# Patient Record
Sex: Female | Born: 1975 | Race: Black or African American | Hispanic: No | Smoking: Never smoker
Health system: Southern US, Community
[De-identification: ages and names within clinical notes are randomized; demographics above are authoritative.]

## PROBLEM LIST (undated history)

## (undated) DIAGNOSIS — D649 Anemia, unspecified: Secondary | ICD-10-CM

## (undated) DIAGNOSIS — J42 Unspecified chronic bronchitis: Secondary | ICD-10-CM

## (undated) DIAGNOSIS — I10 Essential (primary) hypertension: Secondary | ICD-10-CM

## (undated) DIAGNOSIS — E119 Type 2 diabetes mellitus without complications: Secondary | ICD-10-CM

## (undated) HISTORY — PX: LAPAROSCOPIC GASTRIC SLEEVE RESECTION: SHX5895

## (undated) HISTORY — PX: SALPINGOSTOMY: SHX2372

---

## 2003-04-14 ENCOUNTER — Emergency Department (HOSPITAL_COMMUNITY): Admission: EM | Admit: 2003-04-14 | Discharge: 2003-04-14 | Payer: Self-pay | Admitting: Emergency Medicine

## 2003-10-27 ENCOUNTER — Emergency Department (HOSPITAL_COMMUNITY): Admission: EM | Admit: 2003-10-27 | Discharge: 2003-10-28 | Payer: Self-pay | Admitting: Emergency Medicine

## 2004-04-20 ENCOUNTER — Emergency Department (HOSPITAL_COMMUNITY): Admission: EM | Admit: 2004-04-20 | Discharge: 2004-04-20 | Payer: Self-pay | Admitting: *Deleted

## 2006-01-11 ENCOUNTER — Encounter (INDEPENDENT_AMBULATORY_CARE_PROVIDER_SITE_OTHER): Payer: Self-pay | Admitting: Specialist

## 2006-01-11 ENCOUNTER — Other Ambulatory Visit: Admission: RE | Admit: 2006-01-11 | Discharge: 2006-01-11 | Payer: Self-pay | Admitting: Unknown Physician Specialty

## 2006-02-28 ENCOUNTER — Emergency Department (HOSPITAL_COMMUNITY): Admission: EM | Admit: 2006-02-28 | Discharge: 2006-02-28 | Payer: Self-pay | Admitting: Emergency Medicine

## 2006-07-20 ENCOUNTER — Emergency Department (HOSPITAL_COMMUNITY): Admission: EM | Admit: 2006-07-20 | Discharge: 2006-07-20 | Payer: Self-pay | Admitting: Emergency Medicine

## 2006-10-03 ENCOUNTER — Emergency Department (HOSPITAL_COMMUNITY): Admission: EM | Admit: 2006-10-03 | Discharge: 2006-10-03 | Payer: Self-pay | Admitting: Emergency Medicine

## 2007-01-09 ENCOUNTER — Emergency Department (HOSPITAL_COMMUNITY): Admission: EM | Admit: 2007-01-09 | Discharge: 2007-01-09 | Payer: Self-pay | Admitting: Emergency Medicine

## 2007-01-27 ENCOUNTER — Ambulatory Visit (HOSPITAL_COMMUNITY): Admission: RE | Admit: 2007-01-27 | Discharge: 2007-01-27 | Payer: Self-pay | Admitting: Internal Medicine

## 2007-09-28 ENCOUNTER — Emergency Department (HOSPITAL_COMMUNITY): Admission: EM | Admit: 2007-09-28 | Discharge: 2007-09-28 | Payer: Self-pay | Admitting: Emergency Medicine

## 2007-12-17 ENCOUNTER — Emergency Department (HOSPITAL_COMMUNITY): Admission: EM | Admit: 2007-12-17 | Discharge: 2007-12-17 | Payer: Self-pay | Admitting: Emergency Medicine

## 2008-05-23 ENCOUNTER — Encounter: Payer: Self-pay | Admitting: Emergency Medicine

## 2008-05-24 ENCOUNTER — Encounter: Payer: Self-pay | Admitting: Family Medicine

## 2008-05-24 ENCOUNTER — Ambulatory Visit: Payer: Self-pay | Admitting: Family Medicine

## 2008-05-24 ENCOUNTER — Inpatient Hospital Stay (HOSPITAL_COMMUNITY): Admission: AD | Admit: 2008-05-24 | Discharge: 2008-05-25 | Payer: Self-pay | Admitting: Family Medicine

## 2008-05-30 ENCOUNTER — Ambulatory Visit: Payer: Self-pay | Admitting: Obstetrics & Gynecology

## 2010-09-07 LAB — CBC
MCHC: 33.8 g/dL (ref 30.0–36.0)
Platelets: 281 10*3/uL (ref 150–400)
RBC: 4.15 MIL/uL (ref 3.87–5.11)
WBC: 9.1 10*3/uL (ref 4.0–10.5)

## 2010-10-06 NOTE — Discharge Summary (Signed)
Chelsea West, Chelsea West              ACCOUNT NO.:  [id number]   MEDICAL RECORD NO.:  [id number]          PATIENT TYPE:  INP   LOCATION:  9305                          [facility]ACILITY:  [facility]   PHYSICIAN:  Norton Blizzard, MD    DATE OF BIRTH:  09-Aug-1975   DATE OF ADMISSION:  05/24/2008  DATE OF DISCHARGE:  05/25/2008                               DISCHARGE SUMMARY   ADMISSION DIAGNOSIS:  Left ectopic pregnancy.   DISCHARGE DIAGNOSIS:  Status post left salpingectomy.   PROCEDURES:  Minilaparotomy, left salpingectomy done by Dr. Tinnie Gens  on May 24, 2008.   PERTINENT STUDIES:  The patient had a preoperative hemoglobin of 13 and  a postoperative hemoglobin of 12.6.   HOSPITAL COURSE:  The patient is a 35 year old gravida 3, para 0-0-2-0  who was sent from [facility] for evaluation of pain and vaginal  bleeding in early pregnancy.  She had an ultrasound that was notable for  a complex left adnexal mass and no pelvic fluid.  Given this diagnosis,  the patient was counseled regarding surgery.  She did have an  uncomplicated minilaparotomy and left salpingostomy on May 24, 2008.  For further details of this operation, please refer to separate dictated  operative report.  The patient did well after surgery and had an  uncomplicated postoperative course.  Her pain was controlled on oral  pain medications.  She was ambulating and voiding without difficulty and  tolerating a regular diet.  On May 25, 2008, she was deemed stable  for discharge to home.   DISCHARGE MEDICATIONS:  Percocet 5/325, 1-2 tablets p.o. q.6 h. p.r.n.  pain, ibuprofen 600 mg p.o. q.6 h. p.r.n. pain, Colace 100 mg p.o.  b.i.d. p.r.n. constipation.   DISCHARGE INSTRUCTIONS:  The patient was told to avoid lifting anything  heavier than 15 pounds for the next 4 weeks.  She was also told to not  drive while on Percocet and to proceed with activity as tolerated.  She  was told she could shower and could walk up  steps, and to keep her  incision clean and dry. The patient was told to call or come to the MAU  for any postoperative concerns, including but not limited to fevers,  abnormal incisional drainage, normal bleeding, pain that is not  controlled on medicines, or any other concerns.   FOLLOWUP APPOINTMENTS:  The patient is to return to the [facility] for  staple removal next week.  She will be called by the clinic for date and  time for this appointment.  She will also have another postoperative  evaluation 3 or 4 weeks after surgery.      Norton Blizzard, MD  Electronically Signed     UAD/MEDQ  D:  05/25/2008  T:  05/25/2008  Job:  [id number]

## 2010-10-09 NOTE — Op Note (Signed)
NAMEMELLIE, BUCCELLATO              ACCOUNT NO.:  [id number]   MEDICAL RECORD NO.:  [id number]          PATIENT TYPE:  INP   LOCATION:  9305                          [facility]:  [facility]   PHYSICIAN:  Tanya S. Shawnie Pons, M.D.   DATE OF BIRTH:  1976-01-07   DATE OF PROCEDURE:  05/25/2007  DATE OF DISCHARGE:                               OPERATIVE REPORT   PREOPERATIVE DIAGNOSIS:  Ectopic pregnancy.   POSTOPERATIVE DIAGNOSIS:  Ectopic pregnancy.   PROCEDURE:  A minilaparotomy with a left salpingectomy.   SURGEON:  Shelbie Proctor. Shawnie Pons, MD   ASSISTANT:  Zerita Boers, CNM   ANESTHESIA:  General and local, Angelica Pou, MD   FINDINGS:  Left ectopic pregnancy.   SPECIMEN:  Left tube to Pathology.   ESTIMATED BLOOD LOSS:  100 mL.   COMPLICATIONS:  None, immediately known.   REASON FOR PROCEDURE:  Briefly, the patient is a 35 year old gravida 4,  who has had multiple SABs, who comes in with 1 month of bleeding and  minimal amount of pain.  Ultrasound shows nothing in the uterus, beta  hCG was 7200, blood type is O positive, hemoglobin was 13.  There was no  free fluid in the abdomen, but on the left, there was a left-sided mass  at [facility].  The patient was subsequently transferred to [facility], where ultrasonography was repeated and pressure showed a left  ectopic pregnancy.  Given the size of this as well as her beta hCG, it  was felt she was best served with surgical treatment.  The patient is a  larger female, it was felt that the laparoscopy was very difficult.  So,  she was consented for a minilaparotomy.   PROCEDURE:  The patient was taken to the OR where she was placed in  supine position with left lateral tilt.  She was prepped and draped in  usual sterile fashion.  Foley catheter was placed inside her bladder.  A  minilaparotomy of approximately 7-8 cm was performed and Pfannenstiel  fashion was carried out underlying fascia, which was incised in the  midline.  This  incision was extended laterally.  A Kocher clamps used to  elevate the superior portion of the fascia off the underlying  pyramidalis and rectus muscles, blunt dissection was done laterally with  sharp dissection in the midline.  The pyramidalis muscles were then  split with electrocautery and two hemostats were used to tent the  peritoneum, which was entered sharply.  Upon entry into the peritoneum,  the patient was placed in Trendelenburg.  The patient's left tube was  dilated with an ectopic.  This tube was brought out of the incision and  two Kelly clamps were placed across the tube.  There appeared to be a  ruptured corpus luteum on the left ovary as well.  All this tissue was  removed.  Suture ligature was done in a running 2-0 Vicryl along the  tube to provide hemostasis.  Excellent hemostasis was complete and the  ovary was allowed to return to the abdomen.  Attention was then turned  to the  fascia, which was closed with 0 Vicryl in a running fashion.  Subcutaneous tissue was cauterized, any bleeders were cauterized in the  subcu, subcu closed with 0 plain suture, and skin closed using clips.  All instrument and lap counts were correct x2.  The patient was awakened  and taken to recovery room in stable condition.      Shelbie Proctor. Shawnie Pons, M.D.  Electronically Signed     TSP/MEDQ  D:  05/24/2008  T:  05/24/2008  Job:  [id number]

## 2010-10-15 ENCOUNTER — Emergency Department (HOSPITAL_COMMUNITY)
Admission: EM | Admit: 2010-10-15 | Discharge: 2010-10-15 | Disposition: A | Discharge status: Home / Self Care | Payer: Self-pay | Attending: Emergency Medicine | Admitting: Emergency Medicine

## 2010-10-15 ENCOUNTER — Emergency Department (HOSPITAL_COMMUNITY): Payer: Self-pay

## 2010-10-15 DIAGNOSIS — R5381 Other malaise: Secondary | ICD-10-CM | POA: Insufficient documentation

## 2010-10-15 DIAGNOSIS — R1013 Epigastric pain: Secondary | ICD-10-CM | POA: Insufficient documentation

## 2010-10-15 DIAGNOSIS — R0789 Other chest pain: Secondary | ICD-10-CM | POA: Insufficient documentation

## 2010-10-15 DIAGNOSIS — I10 Essential (primary) hypertension: Secondary | ICD-10-CM | POA: Insufficient documentation

## 2010-10-15 DIAGNOSIS — R1310 Dysphagia, unspecified: Secondary | ICD-10-CM | POA: Insufficient documentation

## 2010-10-15 DIAGNOSIS — K219 Gastro-esophageal reflux disease without esophagitis: Secondary | ICD-10-CM | POA: Insufficient documentation

## 2010-10-15 LAB — URINALYSIS, ROUTINE W REFLEX MICROSCOPIC
Bilirubin Urine: NEGATIVE
Glucose, UA: NEGATIVE mg/dL
Hgb urine dipstick: NEGATIVE
Ketones, ur: NEGATIVE mg/dL
Protein, ur: NEGATIVE mg/dL
Urobilinogen, UA: 0.2 mg/dL (ref 0.0–1.0)

## 2010-10-15 LAB — COMPREHENSIVE METABOLIC PANEL
ALT: 14 U/L (ref 0–35)
AST: 16 U/L (ref 0–37)
Albumin: 3.7 g/dL (ref 3.5–5.2)
Alkaline Phosphatase: 66 U/L (ref 39–117)
CO2: 29 mEq/L (ref 19–32)
Chloride: 103 mEq/L (ref 96–112)
Creatinine, Ser: 0.63 mg/dL (ref 0.4–1.2)
GFR calc Af Amer: >60 mL/min (ref >60)
GFR calc non Af Amer: >60 mL/min (ref >60)
Potassium: 4 mEq/L (ref 3.5–5.1)
Sodium: 139 mEq/L (ref 135–145)
Total Bilirubin: 1 mg/dL (ref 0.3–1.2)

## 2010-10-15 LAB — CBC
Hemoglobin: 13.6 g/dL (ref 12.0–15.0)
MCH: 28.8 pg (ref 26.0–34.0)
Platelets: 255 10*3/uL (ref 150–400)
RBC: 4.72 MIL/uL (ref 3.87–5.11)
WBC: 5.8 10*3/uL (ref 4.0–10.5)

## 2010-10-15 LAB — DIFFERENTIAL
Basophils Relative: 0 % (ref 0–1)
Eosinophils Absolute: 0.1 10*3/uL (ref 0.0–0.7)
Monocytes Relative: 10 % (ref 3–12)
Neutro Abs: 3.1 10*3/uL (ref 1.7–7.7)
Neutrophils Relative %: 53 % (ref 43–77)

## 2010-11-12 ENCOUNTER — Ambulatory Visit: Payer: Self-pay | Admitting: Gastroenterology

## 2010-12-11 ENCOUNTER — Emergency Department (HOSPITAL_COMMUNITY): Payer: Self-pay

## 2010-12-11 ENCOUNTER — Emergency Department (HOSPITAL_COMMUNITY)
Admission: EM | Admit: 2010-12-11 | Discharge: 2010-12-12 | Disposition: A | Discharge status: Home / Self Care | Payer: Self-pay | Attending: Emergency Medicine | Admitting: Emergency Medicine

## 2010-12-11 ENCOUNTER — Other Ambulatory Visit (HOSPITAL_COMMUNITY): Payer: Self-pay

## 2010-12-11 DIAGNOSIS — N898 Other specified noninflammatory disorders of vagina: Secondary | ICD-10-CM | POA: Insufficient documentation

## 2010-12-11 DIAGNOSIS — R110 Nausea: Secondary | ICD-10-CM | POA: Insufficient documentation

## 2010-12-11 DIAGNOSIS — R1030 Lower abdominal pain, unspecified: Secondary | ICD-10-CM

## 2010-12-11 DIAGNOSIS — R58 Hemorrhage, not elsewhere classified: Secondary | ICD-10-CM

## 2010-12-11 DIAGNOSIS — R630 Anorexia: Secondary | ICD-10-CM | POA: Insufficient documentation

## 2010-12-11 DIAGNOSIS — O200 Threatened abortion: Secondary | ICD-10-CM

## 2010-12-11 DIAGNOSIS — R109 Unspecified abdominal pain: Secondary | ICD-10-CM | POA: Insufficient documentation

## 2010-12-11 DIAGNOSIS — Z331 Pregnant state, incidental: Secondary | ICD-10-CM

## 2010-12-11 HISTORY — DX: Essential (primary) hypertension: I10

## 2010-12-11 LAB — BASIC METABOLIC PANEL
BUN: 10 mg/dL (ref 6–23)
CO2: 28 mEq/L (ref 19–32)
Glucose, Bld: 81 mg/dL (ref 70–99)
Potassium: 3.6 mEq/L (ref 3.5–5.1)
Sodium: 137 mEq/L (ref 135–145)

## 2010-12-11 LAB — URINALYSIS, ROUTINE W REFLEX MICROSCOPIC
Glucose, UA: NEGATIVE mg/dL
Ketones, ur: 15 mg/dL — AB
Leukocytes, UA: NEGATIVE
Protein, ur: 30 mg/dL — AB
pH: 6 (ref 5.0–8.0)

## 2010-12-11 LAB — CBC
HCT: 42.7 % (ref 36.0–46.0)
Hemoglobin: 14.3 g/dL (ref 12.0–15.0)
MCHC: 33.5 g/dL (ref 30.0–36.0)
MCV: 87.7 fL (ref 78.0–100.0)

## 2010-12-11 LAB — TYPE AND SCREEN
ABO/RH(D): O POS
Antibody Screen: NEGATIVE

## 2010-12-11 LAB — URINE MICROSCOPIC-ADD ON

## 2010-12-11 MED ORDER — SODIUM CHLORIDE 0.9 % IV SOLN: Freq: Once | INTRAVENOUS | Status: DC

## 2010-12-11 NOTE — Progress Notes (Signed)
Pelvic ultra sound suggest possible ectopic pregnancy. Dr Emelda Fear called.

## 2010-12-11 NOTE — ED Provider Notes (Addendum)
History     Chief Complaint  Patient presents with  . Vaginal Bleeding   Patient is a 35 y.o. female presenting with vaginal bleeding.  Vaginal Bleeding    Past Medical History  Diagnosis Date  . Hypertension   . Tubal pregnancy     Past Surgical History  Procedure Date  . Salpingostomy     History reviewed. No pertinent family history.  History  Substance Use Topics  . Smoking status: Never Smoker   . Smokeless tobacco: Not on file  . Alcohol Use: No    OB History    Grav Para Term Preterm Abortions TAB SAB Ect Mult Living   3    3  2 1         Review of Systems  Genitourinary: Positive for vaginal bleeding.    Physical Exam  BP 141/106  Pulse 86  Temp(Src) 99.4 F (37.4 C) (Oral)  Resp 20  Ht 5\' 4"  (1.626 m)  Wt 235 lb (106.595 kg)  BMI 40.34 kg/m2  SpO2 98%  LMP 09/28/2010  Physical Exam  ED Course  Procedures  MDM 11:21 PM Dr Delgaizo--called  [place] results, No IUP    Devoria Albe, MD, FACEP  Medical screening examination/treatment/procedure(s) were conducted as a shared visit with non-physician practitioner(s) and myself.  I personally evaluated the patient during the encounter  Pt is G3P0Ab2, LNP 4/8 started having vaginal bleeding with clots tonight with diffuse lower abd pain. Has seen Dr Emelda Fear in the past. Pt is alert and cooperative.   Devoria Albe, MD, FACEP  1:36 AM  Pt seen by Dr Emelda Fear, feels she has a fibroid obstructing view of gestational sac. Will see in office in 48 hours.  Pt understands instructions.  Ward Givens, MD 12/11/10 [id number]  Ward Givens, MD 12/12/10 1191  Ward Givens, MD 12/12/10 4782

## 2010-12-11 NOTE — ED Provider Notes (Signed)
History     Chief Complaint  Patient presents with  . Vaginal Bleeding   Patient is a 35 y.o. female presenting with vaginal bleeding. The history is provided by the patient.  Vaginal Bleeding This is a new problem. The current episode started today. The problem occurs constantly. The problem has been gradually worsening. Associated symptoms include abdominal pain, anorexia and nausea. Pertinent negatives include no arthralgias, chest pain, coughing, fever, neck pain or vomiting. Associated symptoms comments: Vaginal bleeding with clots. The symptoms are aggravated by nothing. She has tried nothing for the symptoms. The treatment provided no relief.    Past Medical History  Diagnosis Date  . Hypertension   . Tubal pregnancy     Past Surgical History  Procedure Date  . Salpingostomy     History reviewed. No pertinent family history.  History  Substance Use Topics  . Smoking status: Never Smoker   . Smokeless tobacco: Not on file  . Alcohol Use: No    OB History    Grav Para Term Preterm Abortions TAB SAB Ect Mult Living   3    3  2 1         Review of Systems  Constitutional: Negative for fever and activity change.       All ROS Neg except as noted in HPI  HENT: Negative for nosebleeds and neck pain.   Eyes: Negative for photophobia and discharge.  Respiratory: Negative for cough, shortness of breath and wheezing.   Cardiovascular: Negative for chest pain and palpitations.  Gastrointestinal: Positive for nausea, abdominal pain and anorexia. Negative for vomiting and blood in stool.  Genitourinary: Positive for vaginal bleeding. Negative for dysuria, frequency and hematuria.  Musculoskeletal: Negative for back pain and arthralgias.  Skin: Negative.   Neurological: Negative for dizziness, seizures and speech difficulty.  Psychiatric/Behavioral: Negative for hallucinations and confusion.    Physical Exam  BP 132/73  Pulse 74  Temp(Src) 99.4 F (37.4 C) (Oral)   Resp 20  Ht 5\' 4"  (1.626 m)  Wt 235 lb (106.595 kg)  BMI 40.34 kg/m2  SpO2 98%  LMP 09/28/2010  Physical Exam  Nursing note and vitals reviewed. Constitutional: She is oriented to person, place, and time. She appears well-developed and well-nourished.  Non-toxic appearance.  HENT:  Head: Normocephalic.  Right Ear: Tympanic membrane and external ear normal.  Left Ear: Tympanic membrane and external ear normal.  Eyes: EOM and lids are normal. Pupils are equal, round, and reactive to light.  Neck: Normal range of motion. Neck supple. Carotid bruit is not present.  Cardiovascular: Normal rate, regular rhythm, normal heart sounds, intact distal pulses and normal pulses.   Pulmonary/Chest: Breath sounds normal. No respiratory distress.  Abdominal: Soft. Bowel sounds are normal. There is tenderness in the right lower quadrant and left lower quadrant. There is no guarding.  Genitourinary:       Mild to mod blood in the vaginal vault. The os is 0.5cm open. There is cervical tenderness and adnexal tenderness without a palpable mass.  Musculoskeletal: Normal range of motion.  Lymphadenopathy:       Head (right side): No submandibular adenopathy present.       Head (left side): No submandibular adenopathy present.    She has no cervical adenopathy.  Neurological: She is alert and oriented to person, place, and time. She has normal strength. No cranial nerve deficit or sensory deficit.  Skin: Skin is warm and dry.  Psychiatric: She has a normal mood and affect.  Her speech is normal.    ED Course  Procedures  MDM I have reviewed nursing notes, vital signs, and all appropriate lab and imaging results for this patient.      Kathie Dike, [place] 12/11/10 2340

## 2010-12-11 NOTE — ED Notes (Signed)
Dr. Knapp in to see pt. 

## 2010-12-11 NOTE — ED Notes (Signed)
Alert, talking, skin warm and dry.  IV started.  Says increase in pain since  Pelvic exam.

## 2010-12-11 NOTE — Progress Notes (Signed)
Pt ambulatory without problem.

## 2010-12-11 NOTE — ED Notes (Signed)
Pt presents with vaginal bleeding starting today. Pt states she did not have a period last month but today pt started passing large blood clots.

## 2010-12-12 ENCOUNTER — Encounter (HOSPITAL_COMMUNITY): Payer: Self-pay | Admitting: *Deleted

## 2010-12-12 ENCOUNTER — Other Ambulatory Visit: Payer: Self-pay | Admitting: *Deleted

## 2010-12-12 ENCOUNTER — Other Ambulatory Visit (HOSPITAL_COMMUNITY): Payer: Self-pay | Admitting: Emergency Medicine

## 2010-12-12 MED ORDER — MORPHINE SULFATE 4 MG/ML IJ SOLN
4.0000 mg | Freq: Once | INTRAMUSCULAR | Status: AC
  Administered 2010-12-12: 4 mg via INTRAVENOUS
  Filled 2010-12-12: qty 1

## 2010-12-12 MED ORDER — ONDANSETRON HCL 4 MG/2ML IJ SOLN
4.0000 mg | Freq: Once | INTRAMUSCULAR | Status: AC
  Administered 2010-12-12: 4 mg via INTRAVENOUS
  Filled 2010-12-12: qty 2

## 2010-12-12 NOTE — Progress Notes (Signed)
  Patient seen in ED . Consult note written.  See consult tab

## 2010-12-12 NOTE — Consult Note (Signed)
  Chelsea West is a 35 yr old G28 P31 s/p ectopic x 1 with positive HCG test 8000 in [facility] ER tonight with Ultrasound not showing an identifiable gest sac, but also no discernable pelvic fluid or adnexal mass. Fibroids are noted in the anterior uterine fundus, and there is significant transmission loss behind the fibroid .  The possibility of an intrauterine pregnancy is not completely excluded. Clinically the patient is quite stable, with NO significant adnexal discomfort. I will therefore treat pt as a threatened ab, and see her back in 2 days at 9 am for re-eval and repeat ultrasound in my office.  Patient counseled, is comfortable, and comfortable with follow up arrangements and knows to call our Call a Nurse line for additonal directions if symptoms change, and to return to the ER if needed. Blood Type O Pos. Dx Threatened Abortion less than 8 weeks,      Uterine fibroids, anterior uterine wall,      Hx left ectopic.

## 2010-12-12 NOTE — Progress Notes (Signed)
Medical screening examination/treatment/procedure(s) were conducted as a shared visit with non-physician practitioner(s) and myself.  I personally evaluated the patient during the encounter Chelsea Albe, MD, [facility]

## 2010-12-12 NOTE — Progress Notes (Signed)
Case discussed with Dr Emelda Fear. He will see pt in the ED.

## 2010-12-14 LAB — GC/CHLAMYDIA PROBE AMP, GENITAL
Chlamydia, DNA Probe: NEGATIVE
GC Probe Amp, Genital: NEGATIVE

## 2011-02-26 LAB — URINALYSIS, ROUTINE W REFLEX MICROSCOPIC
Bilirubin Urine: NEGATIVE
Protein, ur: 30 mg/dL — AB
Specific Gravity, Urine: >1.03 — ABNORMAL HIGH (ref 1.005–1.030)
Urobilinogen, UA: 0.2 mg/dL (ref 0.0–1.0)

## 2011-02-26 LAB — URINE MICROSCOPIC-ADD ON

## 2011-02-26 LAB — DIFFERENTIAL
Basophils Relative: 0 % (ref 0–1)
Lymphocytes Relative: 34 % (ref 12–46)
Lymphs Abs: 2.3 10*3/uL (ref 0.7–4.0)
Monocytes Absolute: 0.7 10*3/uL (ref 0.1–1.0)
Monocytes Relative: 9 % (ref 3–12)
Neutro Abs: 3.8 10*3/uL (ref 1.7–7.7)

## 2011-02-26 LAB — WET PREP, GENITAL: Yeast Wet Prep HPF POC: NONE SEEN

## 2011-02-26 LAB — CBC
Hemoglobin: 13 g/dL (ref 12.0–15.0)
MCHC: 33.6 g/dL (ref 30.0–36.0)
RBC: 4.35 MIL/uL (ref 3.87–5.11)
WBC: 6.9 10*3/uL (ref 4.0–10.5)

## 2011-02-26 LAB — ABO/RH: ABO/RH(D): O POS

## 2011-02-26 LAB — GC/CHLAMYDIA PROBE AMP, GENITAL: GC Probe Amp, Genital: NEGATIVE

## 2011-03-05 LAB — STREP A DNA PROBE: Group A Strep Probe: NEGATIVE

## 2011-09-10 ENCOUNTER — Emergency Department (HOSPITAL_COMMUNITY): Payer: Self-pay

## 2011-09-10 ENCOUNTER — Encounter (HOSPITAL_COMMUNITY): Payer: Self-pay | Admitting: *Deleted

## 2011-09-10 ENCOUNTER — Emergency Department (HOSPITAL_COMMUNITY)
Admission: EM | Admit: 2011-09-10 | Discharge: 2011-09-10 | Disposition: A | Discharge status: Home / Self Care | Payer: Self-pay | Attending: Emergency Medicine | Admitting: Emergency Medicine

## 2011-09-10 DIAGNOSIS — R6883 Chills (without fever): Secondary | ICD-10-CM | POA: Insufficient documentation

## 2011-09-10 DIAGNOSIS — J3489 Other specified disorders of nose and nasal sinuses: Secondary | ICD-10-CM | POA: Insufficient documentation

## 2011-09-10 DIAGNOSIS — J42 Unspecified chronic bronchitis: Secondary | ICD-10-CM | POA: Insufficient documentation

## 2011-09-10 DIAGNOSIS — R5381 Other malaise: Secondary | ICD-10-CM | POA: Insufficient documentation

## 2011-09-10 DIAGNOSIS — I10 Essential (primary) hypertension: Secondary | ICD-10-CM | POA: Insufficient documentation

## 2011-09-10 DIAGNOSIS — R059 Cough, unspecified: Secondary | ICD-10-CM | POA: Insufficient documentation

## 2011-09-10 DIAGNOSIS — R110 Nausea: Secondary | ICD-10-CM | POA: Insufficient documentation

## 2011-09-10 DIAGNOSIS — R61 Generalized hyperhidrosis: Secondary | ICD-10-CM | POA: Insufficient documentation

## 2011-09-10 DIAGNOSIS — R42 Dizziness and giddiness: Secondary | ICD-10-CM | POA: Insufficient documentation

## 2011-09-10 DIAGNOSIS — R5383 Other fatigue: Secondary | ICD-10-CM | POA: Insufficient documentation

## 2011-09-10 DIAGNOSIS — R05 Cough: Secondary | ICD-10-CM

## 2011-09-10 DIAGNOSIS — R0602 Shortness of breath: Secondary | ICD-10-CM | POA: Insufficient documentation

## 2011-09-10 HISTORY — DX: Unspecified chronic bronchitis: J42

## 2011-09-10 NOTE — Discharge Instructions (Signed)
Arterial Hypertension Arterial hypertension (high blood pressure) is a condition of elevated pressure in your blood vessels. Hypertension over a long period of time is a risk factor for strokes, heart attacks, and heart failure. It is also the leading cause of kidney (renal) failure.  CAUSES   In Adults -- Over 90% of all hypertension has no known cause. This is called essential or primary hypertension. In the other 10% of people with hypertension, the increase in blood pressure is caused by another disorder. This is called secondary hypertension. Important causes of secondary hypertension are:   Heavy alcohol use.   Obstructive sleep apnea.   Hyperaldosterosim (Conn's syndrome).   Steroid use.   Chronic kidney failure.   Hyperparathyroidism.   Medications.   Renal artery stenosis.   Pheochromocytoma.   Cushing's disease.   Coarctation of the aorta.   Scleroderma renal crisis.   Licorice (in excessive amounts).   Drugs (cocaine, methamphetamine).  Your caregiver can explain any items above that apply to you.  In Children -- Secondary hypertension is more common and should always be considered.   Pregnancy -- Few women of childbearing age have high blood pressure. However, up to 10% of them develop hypertension of pregnancy. Generally, this will not harm the woman. It Even be a sign of 3 complications of pregnancy: preeclampsia, HELLP syndrome, and eclampsia. Follow up and control with medication is necessary.  SYMPTOMS   This condition normally does not produce any noticeable symptoms. It is usually found during a routine exam.   Malignant hypertension is a late problem of high blood pressure. It Monger have the following symptoms:   Headaches.   Blurred vision.   End-organ damage (this means your kidneys, heart, lungs, and other organs are being damaged).   Stressful situations can increase the blood pressure. If a person with normal blood pressure has their blood  pressure go up while being seen by their caregiver, this is often termed "white coat hypertension." Its importance is not known. It Alkire be related with eventually developing hypertension or complications of hypertension.   Hypertension is often confused with mental tension, stress, and anxiety.  DIAGNOSIS  The diagnosis is made by 3 separate blood pressure measurements. They are taken at least 1 week apart from each other. If there is organ damage from hypertension, the diagnosis Lemire be made without repeat measurements. Hypertension is usually identified by having blood pressure readings:  Above 140/90 mmHg measured in both arms, at 3 separate times, over a couple weeks.   Over 130/80 mmHg should be considered a risk factor and Grisanti require treatment in patients with diabetes.  Blood pressure readings over 120/80 mmHg are called "pre-hypertension" even in non-diabetic patients. To get a true blood pressure measurement, use the following guidelines. Be aware of the factors that can alter blood pressure readings.  Take measurements at least 1 hour after caffeine.   Take measurements 30 minutes after smoking and without any stress. This is another reason to quit smoking - it raises your blood pressure.   Use a proper cuff size. Ask your caregiver if you are not sure about your cuff size.   Most home blood pressure cuffs are automatic. They will measure systolic and diastolic pressures. The systolic pressure is the pressure reading at the start of sounds. Diastolic pressure is the pressure at which the sounds disappear. If you are elderly, measure pressures in multiple postures. Try sitting, lying or standing.   Sit at rest for a minimum of   5 minutes before taking measurements.   You should not be on any medications like decongestants. These are found in many cold medications.   Record your blood pressure readings and review them with your caregiver.  If you have hypertension:  Your caregiver  may do tests to be sure you do not have secondary hypertension (see "causes" above).   Your caregiver may also look for signs of metabolic syndrome. This is also called Syndrome X or Insulin Resistance Syndrome. You may have this syndrome if you have type 2 diabetes, abdominal obesity, and abnormal blood lipids in addition to hypertension.   Your caregiver will take your medical and family history and perform a physical exam.   Diagnostic tests may include blood tests (for glucose, cholesterol, potassium, and kidney function), a urinalysis, or an EKG. Other tests may also be necessary depending on your condition.  PREVENTION  There are important lifestyle issues that you can adopt to reduce your chance of developing hypertension:  Maintain a normal weight.   Limit the amount of salt (sodium) in your diet.   Exercise often.   Limit alcohol intake.   Get enough potassium in your diet. Discuss specific advice with your caregiver.   Follow a DASH diet (dietary approaches to stop hypertension). This diet is rich in fruits, vegetables, and low-fat dairy products, and avoids certain fats.  PROGNOSIS  Essential hypertension cannot be cured. Lifestyle changes and medical treatment can lower blood pressure and reduce complications. The prognosis of secondary hypertension depends on the underlying cause. Many people whose hypertension is controlled with medicine or lifestyle changes can live a normal, healthy life.  RISKS AND COMPLICATIONS  While high blood pressure alone is not an illness, it often requires treatment due to its short- and long-term effects on many organs. Hypertension increases your risk for:  CVAs or strokes (cerebrovascular accident).   Heart failure due to chronically high blood pressure (hypertensive cardiomyopathy).   Heart attack (myocardial infarction).   Damage to the retina (hypertensive retinopathy).   Kidney failure (hypertensive nephropathy).  Your caregiver can  explain list items above that apply to you. Treatment of hypertension can significantly reduce the risk of complications. TREATMENT   For overweight patients, weight loss and regular exercise are recommended. Physical fitness lowers blood pressure.   Mild hypertension is usually treated with diet and exercise. A diet rich in fruits and vegetables, fat-free dairy products, and foods low in fat and salt (sodium) can help lower blood pressure. Decreasing salt intake decreases blood pressure in a 1/3 of people.   Stop smoking if you are a smoker.  The steps above are highly effective in reducing blood pressure. While these actions are easy to suggest, they are difficult to achieve. Most patients with moderate or severe hypertension end up requiring medications to bring their blood pressure down to a normal level. There are several classes of medications for treatment. Blood pressure pills (antihypertensives) will lower blood pressure by their different actions. Lowering the blood pressure by 10 mmHg may decrease the risk of complications by as much as 25%. The goal of treatment is effective blood pressure control. This will reduce your risk for complications. Your caregiver will help you determine the best treatment for you according to your lifestyle. What is excellent treatment for one person, may not be for you. HOME CARE INSTRUCTIONS   Do not smoke.   Follow the lifestyle changes outlined in the "Prevention" section.   If you are on medications, follow the directions   carefully. Blood pressure medications must be taken as prescribed. Skipping doses reduces their benefit. It also puts you at risk for problems.   Follow up with your caregiver, as directed.   If you are asked to monitor your blood pressure at home, follow the guidelines in the "Diagnosis" section above.  SEEK MEDICAL CARE IF:   You think you are having medication side effects.   You have recurrent headaches or lightheadedness.     You have swelling in your ankles.   You have trouble with your vision.  SEEK IMMEDIATE MEDICAL CARE IF:   You have sudden onset of chest pain or pressure, difficulty breathing, or other symptoms of a heart attack.   You have a severe headache.   You have symptoms of a stroke (such as sudden weakness, difficulty speaking, difficulty walking).  MAKE SURE YOU:   Understand these instructions.   Will watch your condition.   Will get help right away if you are not doing well or get worse.  Document Released: 05/10/2005 Document Revised: 04/29/2011 Document Reviewed: 12/08/2006 [facility] Patient Information 2012 [place], [place].Cough, Adult  A cough is a reflex. It helps you clear your throat and airways. A cough can help heal your body. A cough can last 2 or 3 weeks (acute) or may last more than 8 weeks (chronic). Some common causes of a cough can include an infection, allergy, or a cold. HOME CARE  Only take medicine as told by your doctor.   If given, take your medicines (antibiotics) as told. Finish them even if you start to feel better.   Use a cold steam vaporizer or humidier in your home. This can help loosen thick spit (secretions).   Sleep so you are almost sitting up (semi-upright). Use pillows to do this. This helps reduce coughing.   Rest as needed.   Stop smoking if you smoke.  GET HELP RIGHT AWAY IF:  You have yellowish-white fluid (pus) in your thick spit.   Your cough gets worse.   Your medicine does not reduce coughing, and you are losing sleep.   You cough up blood.   You have trouble breathing.   Your pain gets worse and medicine does not help.   You have a fever.  MAKE SURE YOU:   Understand these instructions.   Will watch your condition.   Will get help right away if you are not doing well or get worse.  Document Released: 01/21/2011 Document Revised: 04/29/2011 Document Reviewed: 01/21/2011 [facility] Patient Information 2012 [place],  [place].

## 2011-09-10 NOTE — ED Notes (Signed)
Pt presents to er with c/o being sick that started two days ago, pt states that she started getting sick with a cough, productive with thick yellow sputum production,  Sob with exertion, chills, nausea during the night, vomiting this am, aches to bilateral legs and lower back area.

## 2011-09-10 NOTE — ED Provider Notes (Signed)
History  This chart was scribed for Joya Gaskins, MD by Cherlynn Perches. The patient was seen in room APA04/APA04. Patient's care was started at 0853.      CSN: [id number]  Arrival date & time 09/10/11  7829   First MD Initiated Contact with Patient 09/10/11 [phone]      Chief Complaint  Patient presents with  . Cough  . Wheezing  . Chills  . Nausea  . Emesis     HPI  Chelsea West is a 36 y.o. female with a h/o chronic bronchitis who presents to the Emergency Department complaining of 2 days of sudden onset, gradually worsening mild to moderate cough with associated nausea, SOB, dizziness, diaphoresis, chills, weakness, vomiting, and nasal congestion. Pt reports that the cough is a productive cough with thick yellow sputum and that she coughed up a little bit of blood 2 days ago. Pt states that symptoms are made worse by walking. Pt has no known ill contacts. Pt denies sore throat, abdominal pain, chest pain, and HA. Pt denies smoking and alcohol use.  Past Medical History  Diagnosis Date  . Hypertension   . Tubal pregnancy   . Bronchitis, chronic     Past Surgical History  Procedure Date  . Salpingostomy     No family history on file.  History  Substance Use Topics  . Smoking status: Never Smoker   . Smokeless tobacco: Not on file  . Alcohol Use: No    OB History    Grav Para Term Preterm Abortions TAB SAB Ect Mult Living   3    3  2 1         Review of Systems  A complete 10 system review of systems was obtained and all systems are negative except as noted in the HPI and PMH.   Allergies  Penicillins  Home Medications  No current outpatient prescriptions on file.  BP 138/100  Pulse 88  Temp(Src) 98.2 F (36.8 C) (Oral)  Resp 21  Ht 5\' 3"  (1.6 m)  Wt 235 lb (106.595 kg)  BMI 41.63 kg/m2  SpO2 98%  LMP 08/18/2011  Physical Exam  CONSTITUTIONAL: Well developed/well nourished HEAD AND FACE: Normocephalic/atraumatic EYES:  EOMI/PERRL ENMT: Mucous membranes moist NECK: supple no meningeal signs SPINE:entire spine nontender CV: S1/S2 noted, no murmurs/rubs/gallops noted LUNGS: Lungs are clear to auscultation bilaterally, no apparent distress ABDOMEN: soft, nontender, no rebound or guarding GU:no cva tenderness NEURO: Pt is awake/alert, moves all extremitiesx4 EXTREMITIES: pulses normal, full ROM, no edema noted SKIN: warm, color normal PSYCH: no abnormalities of mood noted   ED Course  Procedures  DIAGNOSTIC STUDIES: Oxygen Saturation is 98% on room air, normal by my interpretation.    COORDINATION OF CARE: 9:17AM - Will get a chest xray for patient. Patient understands and agrees with initial ED impression and plan with expectations set for ED visit.  I walked patient around the ER and she had no distress, was able to speak clearly.  CXR reviewed and negative.  Given recent productive cough and congestion, likely viral URI.  I doubt acute cardiopulmonary emergency at this time.  Advised f/u for her HTN.  She is nontoxic and in no distress  The patient appears reasonably screened and/or stabilized for discharge and I doubt any other medical condition or other [facility] requiring further screening, evaluation, or treatment in the ED at this time prior to discharge.    Labs Reviewed - No data to display Dg Chest 2 View  09/10/2011  *RADIOLOGY REPORT*  Clinical Data: 36 year old female with cough and shortness of breath.  CHEST - 2 VIEW  Comparison: 10/15/2010 and earlier.  Findings: Stable lung volumes.  The cardiac size at the upper limits of normal and stable. Other mediastinal contours are within normal limits.  Visualized tracheal air column is within normal limits.  The lungs are clear.  No pneumothorax or effusion. No acute osseous abnormality identified.  IMPRESSION: No acute cardiopulmonary abnormality.  Original Report Authenticated By: Harley Hallmark, M.D.     1. Cough   2. Hypertension        MDM  Nursing notes reviewed and considered in documentation xrays reviewed and considered Previous records reviewed and considered     I personally performed the services described in this documentation, which was scribed in my presence. The recorded information has been reviewed and considered.       Joya Gaskins, MD 09/10/11 [phone]

## 2012-12-13 IMAGING — CR DG CHEST 2V
2 series · 2 of 2 positions shown · non-contrast
Comparison: 01/27/2007

CLINICAL DATA: Abdominal pain, chest pain, COPD, hypertension,
indigestion

CHEST - 2 VIEW

[view not recorded (1 of 2)]
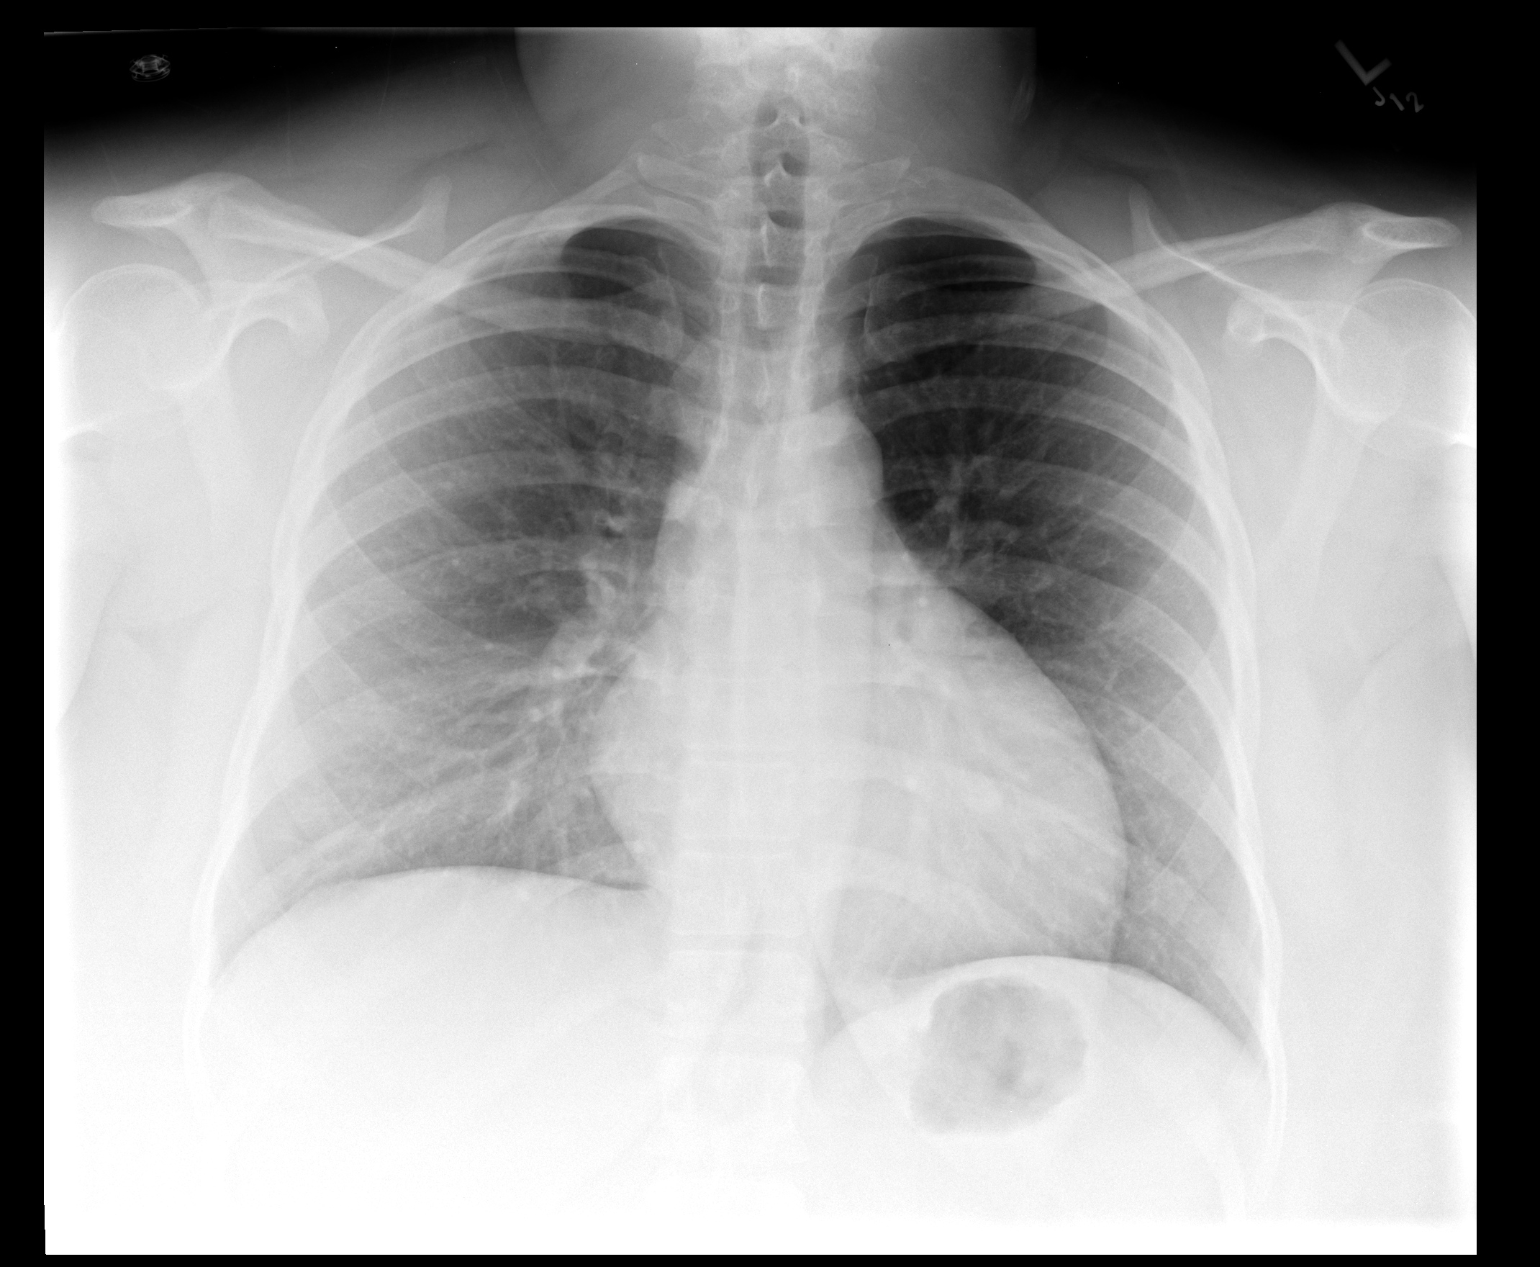

[view not recorded (2 of 2)]
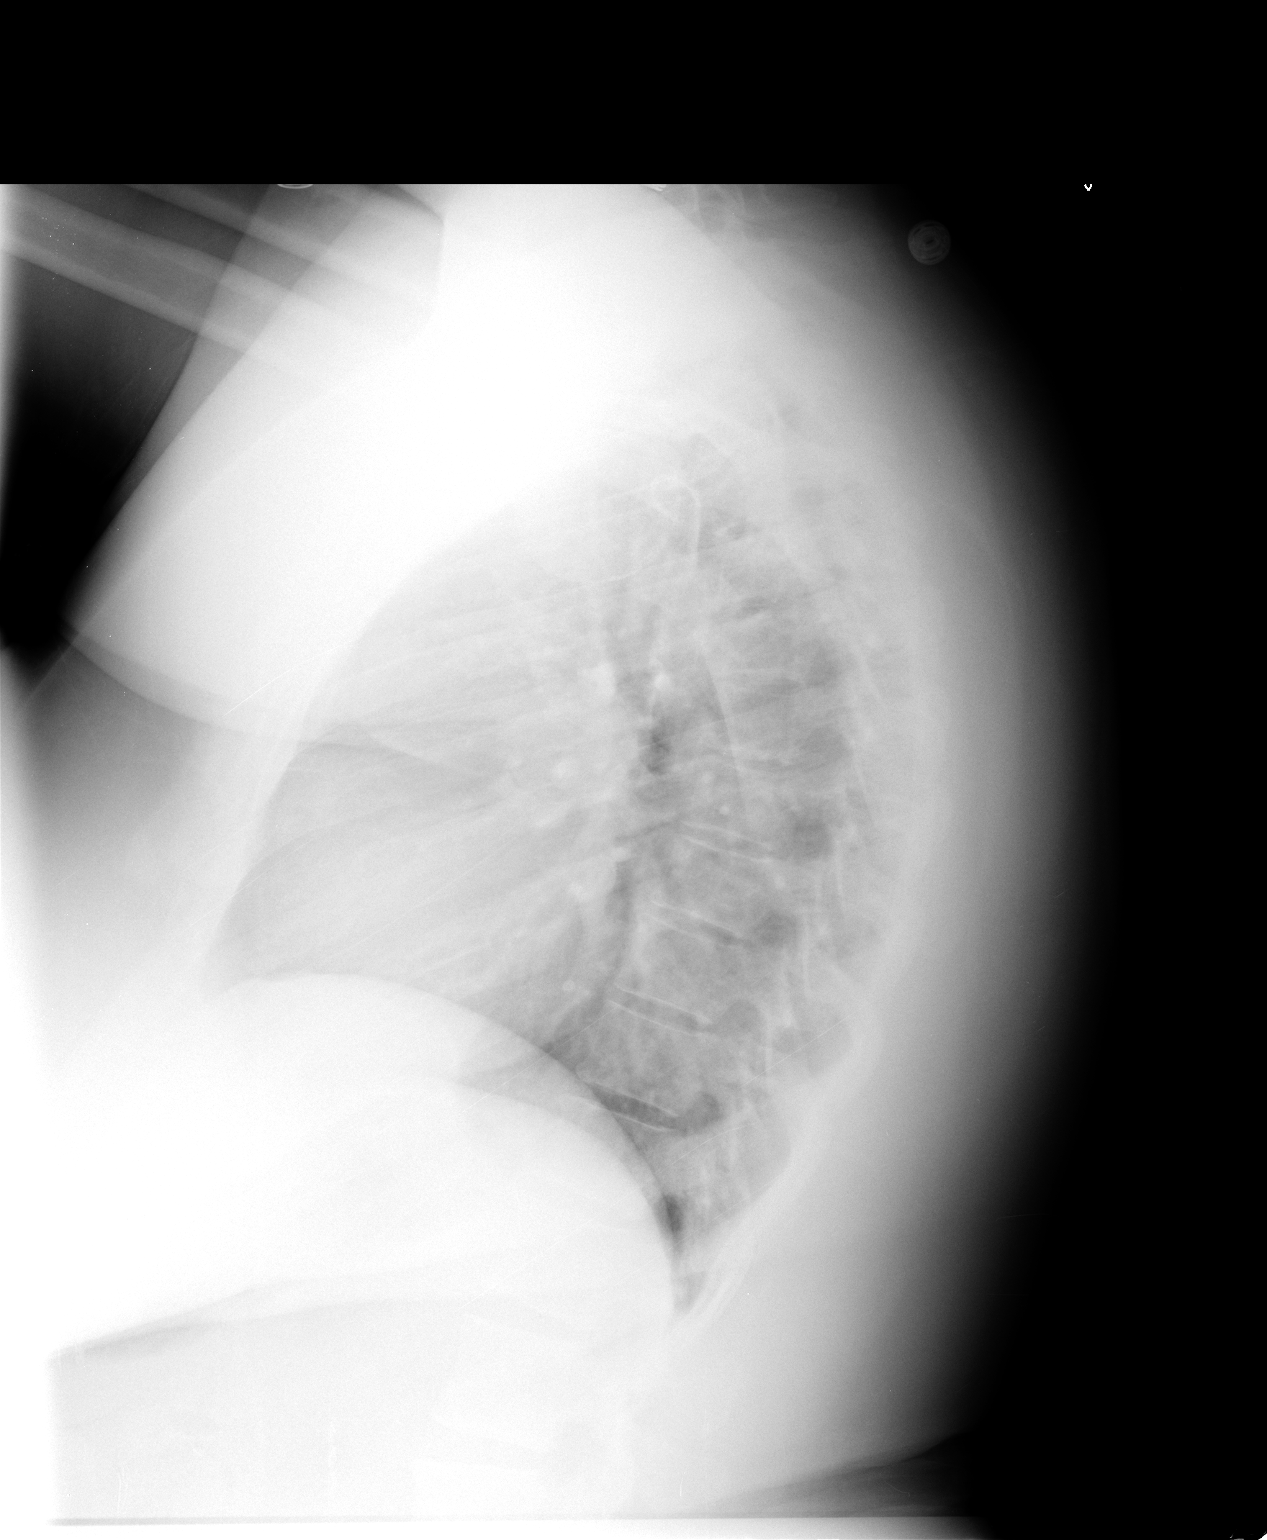

[2 of 2 positions shown; findings below may reference images not displayed]

FINDINGS: Borderline enlargement of cardiac silhouette.
Mediastinal contours and pulmonary vascularity normal.
Lungs clear.
No pleural effusion or pneumothorax.
Bones unremarkable.
IMPRESSION: Borderline enlargement of cardiac silhouette.
No acute abnormalities.

## 2014-03-25 ENCOUNTER — Encounter (HOSPITAL_COMMUNITY): Payer: Self-pay | Admitting: *Deleted

## 2019-05-16 ENCOUNTER — Encounter (HOSPITAL_COMMUNITY): Payer: Self-pay | Admitting: Emergency Medicine

## 2019-05-16 ENCOUNTER — Other Ambulatory Visit: Payer: Self-pay

## 2019-05-16 ENCOUNTER — Emergency Department (HOSPITAL_COMMUNITY)
Admission: EM | Admit: 2019-05-16 | Discharge: 2019-05-17 | Disposition: A | Discharge status: Home / Self Care | Payer: Self-pay | Attending: Emergency Medicine | Admitting: Emergency Medicine

## 2019-05-16 DIAGNOSIS — I10 Essential (primary) hypertension: Secondary | ICD-10-CM | POA: Insufficient documentation

## 2019-05-16 DIAGNOSIS — E860 Dehydration: Secondary | ICD-10-CM | POA: Insufficient documentation

## 2019-05-16 LAB — URINALYSIS, ROUTINE W REFLEX MICROSCOPIC
Bilirubin Urine: NEGATIVE
Glucose, UA: NEGATIVE mg/dL
Hgb urine dipstick: NEGATIVE
Ketones, ur: NEGATIVE mg/dL
Leukocytes,Ua: NEGATIVE
Nitrite: NEGATIVE
Protein, ur: NEGATIVE mg/dL
Specific Gravity, Urine: 1.016 (ref 1.005–1.030)
pH: 5 (ref 5.0–8.0)

## 2019-05-16 LAB — CBC WITH DIFFERENTIAL/PLATELET
Abs Immature Granulocytes: 0.05 10*3/uL (ref 0.00–0.07)
Basophils Absolute: 0 10*3/uL (ref 0.0–0.1)
Basophils Relative: 1 %
Eosinophils Absolute: 0.2 10*3/uL (ref 0.0–0.5)
Eosinophils Relative: 2 %
HCT: 35 % — ABNORMAL LOW (ref 36.0–46.0)
Hemoglobin: 10.2 g/dL — ABNORMAL LOW (ref 12.0–15.0)
Immature Granulocytes: 1 %
Lymphocytes Relative: 40 %
Lymphs Abs: 3 10*3/uL (ref 0.7–4.0)
MCH: 22.3 pg — ABNORMAL LOW (ref 26.0–34.0)
MCHC: 29.1 g/dL — ABNORMAL LOW (ref 30.0–36.0)
MCV: 76.6 fL — ABNORMAL LOW (ref 80.0–100.0)
Monocytes Absolute: 1.1 10*3/uL — ABNORMAL HIGH (ref 0.1–1.0)
Monocytes Relative: 14 %
Neutro Abs: 3.2 10*3/uL (ref 1.7–7.7)
Neutrophils Relative %: 42 %
Platelets: 480 10*3/uL — ABNORMAL HIGH (ref 150–400)
RBC: 4.57 MIL/uL (ref 3.87–5.11)
RDW: 18.5 % — ABNORMAL HIGH (ref 11.5–15.5)
WBC: 7.4 10*3/uL (ref 4.0–10.5)
nRBC: 0 % (ref 0.0–0.2)

## 2019-05-16 LAB — PREGNANCY, URINE: Preg Test, Ur: NEGATIVE

## 2019-05-16 LAB — BASIC METABOLIC PANEL
Anion gap: 12 (ref 5–15)
BUN: 35 mg/dL — ABNORMAL HIGH (ref 6–20)
CO2: 23 mmol/L (ref 22–32)
Calcium: 8.7 mg/dL — ABNORMAL LOW (ref 8.9–10.3)
Chloride: 99 mmol/L (ref 98–111)
Creatinine, Ser: 1.42 mg/dL — ABNORMAL HIGH (ref 0.44–1.00)
GFR calc Af Amer: 53 mL/min — ABNORMAL LOW (ref >60)
GFR calc non Af Amer: 45 mL/min — ABNORMAL LOW (ref >60)
Glucose, Bld: 99 mg/dL (ref 70–99)
Potassium: 4 mmol/L (ref 3.5–5.1)
Sodium: 134 mmol/L — ABNORMAL LOW (ref 135–145)

## 2019-05-16 LAB — TSH: TSH: 2.772 u[IU]/mL (ref 0.350–4.500)

## 2019-05-16 MED ORDER — SODIUM CHLORIDE 0.9 % IV BOLUS
1000.0000 mL | Freq: Once | INTRAVENOUS | Status: AC
  Administered 2019-05-16: 23:00:00 1000 mL via INTRAVENOUS

## 2019-05-16 NOTE — ED Triage Notes (Signed)
Pt c/o hypotension (88/42) x 3 days, reports hx of HTN with no med changes, takes spironolactone 25mg  1 tab daily and lisinopril-HCTZ 20/12.5mg  1 table daily; triage BP 104/63

## 2019-05-16 NOTE — ED Provider Notes (Signed)
[facility] EMERGENCY DEPARTMENT Provider Note   CSN: [id number] Arrival date & time: 05/16/19  1949     History Chief Complaint  Patient presents with  . Hypotension    Chelsea West is a 43 y.o. female.  HPI      Chelsea West is a 43 y.o. female with past medical history of hypertension  presents to the Emergency Department complaining of low blood pressure for 3 to 4 days.  She states that she has been taking lisinopril and spironolactone for some time and typically has elevated blood pressure despite her taking her medications but recently her blood pressure has been low.  She reports 88/42 at home earlier today.  She also complains of cramping to both lower legs and feeling weak all over with episodes of feeling "faint" upon sudden movement or standing.  She denies headache, visual changes, fever or chills, chest pain, shortness of breath or abdominal pain.  No dysuria.  She reports  being told by her PCP 2 years ago that she was borderline diabetic.    Past Medical History:  Diagnosis Date  . Bronchitis, chronic (HCC)   . Hypertension   . Tubal pregnancy     There are no problems to display for this patient.   Past Surgical History:  Procedure Laterality Date  . SALPINGOSTOMY       OB History    Gravida  3   Para      Term      Preterm      AB  3   Living        SAB  2   TAB      Ectopic  1   Multiple      Live Births              No family history on file.  Social History   Tobacco Use  . Smoking status: Never Smoker  . Smokeless tobacco: Never Used  Substance Use Topics  . Alcohol use: No  . Drug use: No    Home Medications Prior to Admission medications   Not on File    Allergies    Penicillins  Review of Systems   Review of Systems  Constitutional: Positive for fatigue. Negative for appetite change, chills and fever.  HENT: Negative for sore throat.   Respiratory: Negative for cough, chest tightness and  shortness of breath.   Cardiovascular: Negative for chest pain.  Gastrointestinal: Negative for abdominal pain, diarrhea, nausea and vomiting.  Genitourinary: Positive for frequency. Negative for difficulty urinating and dysuria.  Musculoskeletal: Negative for arthralgias, neck pain and neck stiffness.  Skin: Negative for rash.  Neurological: Positive for weakness and light-headedness. Negative for dizziness, syncope and headaches.    Physical Exam Updated Vital Signs BP 104/63 (BP Location: Right Arm)   Pulse (!) 107   Temp 98 F (36.7 C) (Oral)   Resp 20   Ht 5\' 3"  (1.6 m)   Wt 118.8 kg   LMP 05/01/2019   SpO2 99%   BMI 46.41 kg/m   Physical Exam Vitals and nursing note reviewed.  Constitutional:      General: She is not in acute distress.    Appearance: Normal appearance.  HENT:     Mouth/Throat:     Mouth: Mucous membranes are moist.     Pharynx: Oropharynx is clear.  Eyes:     Extraocular Movements: Extraocular movements intact.     Conjunctiva/sclera: Conjunctivae normal.  Pupils: Pupils are equal, round, and reactive to light.  Cardiovascular:     Rate and Rhythm: Normal rate and regular rhythm.     Pulses: Normal pulses.  Pulmonary:     Effort: Pulmonary effort is normal. No respiratory distress.     Breath sounds: No wheezing.  Chest:     Chest wall: No tenderness.  Abdominal:     General: There is no distension.     Palpations: Abdomen is soft.     Tenderness: There is no abdominal tenderness. There is no guarding.  Musculoskeletal:        General: Normal range of motion.     Cervical back: Normal range of motion. No tenderness.  Lymphadenopathy:     Cervical: No cervical adenopathy.  Skin:    General: Skin is warm.     Capillary Refill: Capillary refill takes less than 2 seconds.     Findings: No erythema or rash.  Neurological:     General: No focal deficit present.     Mental Status: She is alert.     Sensory: Sensation is intact. No sensory  deficit.     Motor: Motor function is intact. No weakness.     Coordination: Coordination is intact.     Gait: Gait is intact.     Comments: CN II-XII intact.  Speech clear, no pronator drift.  nml finger-nose, heel shin testing.       ED Results / Procedures / Treatments   Labs (all labs ordered are listed, but only abnormal results are displayed) Labs Reviewed  BASIC METABOLIC PANEL - Abnormal; Notable for the following components:      Result Value   Sodium 134 (*)    BUN 35 (*)    Creatinine, Ser 1.42 (*)    Calcium 8.7 (*)    GFR calc non Af Amer 45 (*)    GFR calc Af Amer 53 (*)    All other components within normal limits  CBC WITH DIFFERENTIAL/PLATELET - Abnormal; Notable for the following components:   Hemoglobin 10.2 (*)    HCT 35.0 (*)    MCV 76.6 (*)    MCH 22.3 (*)    MCHC 29.1 (*)    RDW 18.5 (*)    Platelets 480 (*)    Monocytes Absolute 1.1 (*)    All other components within normal limits  URINALYSIS, ROUTINE W REFLEX MICROSCOPIC  PREGNANCY, URINE  TSH    EKG EKG Interpretation  Date/Time:  Thursday May 17 2019 00:46:14 EST Ventricular Rate:  87 PR Interval:    QRS Duration: 85 QT Interval:  373 QTC Calculation: 449 R Axis:   88 Text Interpretation: Sinus rhythm Low voltage, precordial leads Probable anteroseptal infarct, old No previous ECGs available Confirmed by Ripley Fraise [phone]) on 05/17/2019 1:07:36 AM     Radiology No results found.   Procedures Procedures (including critical care time)  Medications Ordered in ED Medications - No data to display  ED Course  I have reviewed the triage vital signs and the nursing notes.  Pertinent labs & imaging results that were available during my care of the patient were reviewed by me and considered in my medical decision making (see chart for details).    MDM Rules/Calculators/A&P                      Patient well-appearing.  Nontoxic-appearing.  Subjective hypotension at home.   Blood pressure 332 systolic here.  Patient is ambulatory,  gait steady.  No focal neuro deficits.  Patient does not appear septic.  Will check labs and orthostatic vital signs.   On recheck, patient has ambulated in the dept and gait is steady.  She has drank fluids and received IVF's and reports feeling much better.     Orthostatic VS for the past 24 hrs:  BP- Lying Pulse- Lying BP- Sitting Pulse- Sitting BP- Standing at 0 minutes Pulse- Standing at 0 minutes  05/17/19 0017 107/55 96 -- -- 99/63 99  05/16/19 2200 (!) 87/57 102 98/64 89 (!) 87/53 101    Patient appears appropriate for discharge home, will have her hold her blood pressure medications for 24 hours and start back with one half dose of her spironolactone until recheck with PCP and closely monitor blood pressure at home.   She agrees to follow-up with her PCPs office tomorrow for recheck of kidney function.    Final Clinical Impression(s) / ED Diagnoses Final diagnoses:  Dehydration    Rx / DC Orders ED Discharge Orders    None       Pauline Ausriplett, Mikie Misner, PA-C 05/17/19 0108    Geoffery Lyonselo, [place], MD 05/18/19 [phone]

## 2019-05-17 NOTE — Discharge Instructions (Signed)
Hold your blood pressure medications for 1 day.  It is important that you continue to drink plenty of water for the next several days.  Take 1/2 tablet of your spironolactone and call Dr. Josephine Cables office tomorrow for recheck.  Return to the ER for any worsening symptoms.  You will need to have your kidney functions rechecked next week.

## 2019-05-17 NOTE — ED Notes (Signed)
Pt ambulatory to waiting room. Pt verbalized understanding of discharge instructions.   

## 2019-06-08 ENCOUNTER — Ambulatory Visit: Payer: Self-pay | Admitting: Adult Health

## 2019-06-11 ENCOUNTER — Other Ambulatory Visit: Payer: Self-pay | Admitting: Internal Medicine

## 2019-06-11 DIAGNOSIS — Z1231 Encounter for screening mammogram for malignant neoplasm of breast: Secondary | ICD-10-CM

## 2020-02-06 ENCOUNTER — Other Ambulatory Visit: Payer: Self-pay

## 2021-06-04 ENCOUNTER — Other Ambulatory Visit: Payer: Self-pay

## 2021-06-04 ENCOUNTER — Encounter (HOSPITAL_COMMUNITY): Payer: Self-pay | Admitting: *Deleted

## 2021-06-04 ENCOUNTER — Emergency Department (HOSPITAL_COMMUNITY)
Admission: EM | Admit: 2021-06-04 | Discharge: 2021-06-04 | Disposition: A | Discharge status: Home / Self Care | Payer: 59 | Attending: Emergency Medicine | Admitting: Emergency Medicine

## 2021-06-04 DIAGNOSIS — I10 Essential (primary) hypertension: Secondary | ICD-10-CM | POA: Insufficient documentation

## 2021-06-04 DIAGNOSIS — Z79899 Other long term (current) drug therapy: Secondary | ICD-10-CM | POA: Insufficient documentation

## 2021-06-04 DIAGNOSIS — J343 Hypertrophy of nasal turbinates: Secondary | ICD-10-CM | POA: Diagnosis not present

## 2021-06-04 DIAGNOSIS — R059 Cough, unspecified: Secondary | ICD-10-CM | POA: Diagnosis present

## 2021-06-04 DIAGNOSIS — U071 COVID-19: Secondary | ICD-10-CM | POA: Insufficient documentation

## 2021-06-04 DIAGNOSIS — E119 Type 2 diabetes mellitus without complications: Secondary | ICD-10-CM | POA: Diagnosis not present

## 2021-06-04 HISTORY — DX: Anemia, unspecified: D64.9

## 2021-06-04 HISTORY — DX: Type 2 diabetes mellitus without complications: E11.9

## 2021-06-04 LAB — RESP PANEL BY RT-PCR (FLU A&B, COVID) ARPGX2
Influenza A by PCR: NEGATIVE
Influenza B by PCR: NEGATIVE
SARS Coronavirus 2 by RT PCR: POSITIVE — AB

## 2021-06-04 MED ORDER — MOLNUPIRAVIR EUA 200MG CAPSULE: 4.0000 | ORAL_CAPSULE | Freq: Two times a day (BID) | ORAL | 0 refills | Status: AC

## 2021-06-04 MED ORDER — ACETAMINOPHEN 325 MG PO TABS
650.0000 mg | ORAL_TABLET | Freq: Once | ORAL | Status: AC
  Administered 2021-06-04: 650 mg via ORAL
  Filled 2021-06-04: qty 2

## 2021-06-04 MED ORDER — BENZONATATE 200 MG PO CAPS: 200.0000 mg | ORAL_CAPSULE | Freq: Three times a day (TID) | ORAL | 0 refills | Status: DC | PRN

## 2021-06-04 NOTE — ED Triage Notes (Signed)
Pt with chills, body aches, sneezing and coughing. Loss of taste and smell 2 days ago.

## 2021-06-04 NOTE — ED Provider Notes (Signed)
[facility] EMERGENCY DEPARTMENT Provider Note   CSN: [id number] Arrival date & time: 06/04/21  1116     History  Chief Complaint  Patient presents with   Chills    BURGANDY HACKWORTH is a 46 y.o. female.  HPI     ZILPHIA KOZINSKI is a 46 y.o. female past medical history of diabetes, anemia, and hypertension who presents to the Emergency Department complaining of generalized body aches, cough,Sneezing and chills.  Symptoms have been present for 2 days.  Also endorses loss of taste and smell 1 to 2 days ago.  Some intermittent loose stools.  No fever chest pain or shortness of breath.  She is drinking orange juice and taking Tylenol intermittently.  Concerned that she may have COVID.  Has been vaccinated x2 and 1 booster.  Home Medications Prior to Admission medications   Medication Sig Start Date End Date Taking? Authorizing Provider  lisinopril-hydrochlorothiazide (ZESTORETIC) 20-12.5 MG tablet Take 1 tablet by mouth daily. 04/25/19   [provider]  spironolactone (ALDACTONE) 25 MG tablet Take 25 mg by mouth daily. 04/25/19   [provider]      Allergies    Penicillins    Review of Systems   Review of Systems  Constitutional:  Positive for chills. Negative for fever.  HENT:  Positive for congestion and sneezing. Negative for sore throat.   Respiratory:  Positive for cough. Negative for shortness of breath.   Cardiovascular:  Negative for chest pain.  Gastrointestinal:  Positive for diarrhea. Negative for abdominal pain, nausea and vomiting.  Genitourinary:  Negative for decreased urine volume, difficulty urinating and dysuria.  Musculoskeletal:  Positive for myalgias.  Neurological:  Negative for dizziness, syncope, weakness and headaches.  All other systems reviewed and are negative.  Physical Exam Updated Vital Signs BP (!) 144/100    Pulse 81    Temp 98.5 F (36.9 C) (Oral)    Resp 20    Ht 5\' 2"  (1.575 m)    Wt 118.8 kg    LMP 05/24/2021    SpO2  100%    BMI 47.92 kg/m  Physical Exam Vitals and nursing note reviewed.  Constitutional:      Appearance: Normal appearance.  HENT:     Head: Normocephalic.     Nose:     Right Turbinates: Enlarged.     Left Turbinates: Enlarged.     Mouth/Throat:     Mouth: Mucous membranes are moist.  Eyes:     Pupils: Pupils are equal, round, and reactive to light.  Cardiovascular:     Rate and Rhythm: Normal rate and regular rhythm.     Pulses: Normal pulses.  Pulmonary:     Effort: Pulmonary effort is normal.     Breath sounds: Normal breath sounds. No wheezing.  Abdominal:     Palpations: Abdomen is soft.     Tenderness: There is no abdominal tenderness. There is no guarding or rebound.  Musculoskeletal:        General: Normal range of motion.     Cervical back: Normal range of motion.  Skin:    General: Skin is warm and dry.     Findings: No rash.  Neurological:     General: No focal deficit present.     Mental Status: She is alert.     Sensory: No sensory deficit.     Motor: No weakness.    ED Results / Procedures / Treatments   Labs (all labs ordered are listed,  but only abnormal results are displayed) Labs Reviewed  RESP PANEL BY RT-PCR (FLU A&B, COVID) ARPGX2 - Abnormal; Notable for the following components:      Result Value   SARS Coronavirus 2 by RT PCR POSITIVE (*)    All other components within normal limits    EKG None  Radiology No results found.  Procedures Procedures    Medications Ordered in ED Medications  acetaminophen (TYLENOL) tablet 650 mg (650 mg Oral Given 06/04/21 1432)    ED Course/ Medical Decision Making/ A&P                           Medical Decision Making  Patient here with URI symptoms with cough, chills, loss of taste and smell.  Symptoms present for 2 days.  No prior history of COVID, has been vaccinated x2 with 1 booster.  On exam, patient well-appearing nontoxic.  Hypertensive, but denies chest pain or shortness of breath.   Has not taken her antihypertensive medications today.  No increased work of breathing on exam.  Mucous membranes are moist.  Differential diagnosis would include COVID, influenza, pneumonia, other viral process  On recheck, patient resting comfortably.  She is ambulated to the restroom without difficulty or labored breathing.  Viral testing positive for COVID.  Patient does have risk factors that include hypertension, obesity and diabetes.  Discussed treatment with antivirals.  Patient agreeable to plan.  She appears appropriate for discharge home, will follow-up closely with PCP.  Return precautions discussed.        Final Clinical Impression(s) / ED Diagnoses Final diagnoses:  COVID    Rx / DC Orders ED Discharge Orders     None         Pauline Aus, PA-C 06/04/21 1448    Jacalyn Lefevre, MD 06/04/21 1535

## 2021-06-04 NOTE — ED Notes (Signed)
Pt d/c home per MD order. Discharge summary reviewed, pt verbalizes understanding. Ambulatory off unit. No s/s of acute distress noted at discharge.  °

## 2021-06-04 NOTE — Discharge Instructions (Addendum)
Your COVID test today is positive.  You have been prescribed antivirals that may help with your symptoms.  I recommend isolation at home for 5 days.  On day 5 if you no longer have a fever without needing medications to reduce fever and you are feeling better, you may return to normal activities, but continue to wear a mask for 5 additional days.  Follow-up with your primary care provider for recheck.  Return emergency department for any new or worsening symptoms.

## 2021-08-15 ENCOUNTER — Encounter (HOSPITAL_COMMUNITY): Payer: Self-pay | Admitting: Emergency Medicine

## 2021-08-15 ENCOUNTER — Emergency Department (HOSPITAL_COMMUNITY): Payer: 59

## 2021-08-15 ENCOUNTER — Other Ambulatory Visit: Payer: Self-pay

## 2021-08-15 ENCOUNTER — Emergency Department (HOSPITAL_COMMUNITY)
Admission: EM | Admit: 2021-08-15 | Discharge: 2021-08-15 | Disposition: A | Discharge status: Home / Self Care | Payer: 59 | Attending: Emergency Medicine | Admitting: Emergency Medicine

## 2021-08-15 DIAGNOSIS — Z7984 Long term (current) use of oral hypoglycemic drugs: Secondary | ICD-10-CM | POA: Diagnosis not present

## 2021-08-15 DIAGNOSIS — E119 Type 2 diabetes mellitus without complications: Secondary | ICD-10-CM | POA: Insufficient documentation

## 2021-08-15 DIAGNOSIS — I808 Phlebitis and thrombophlebitis of other sites: Secondary | ICD-10-CM | POA: Insufficient documentation

## 2021-08-15 DIAGNOSIS — M79602 Pain in left arm: Secondary | ICD-10-CM | POA: Diagnosis present

## 2021-08-15 DIAGNOSIS — I809 Phlebitis and thrombophlebitis of unspecified site: Secondary | ICD-10-CM

## 2021-08-15 NOTE — ED Provider Notes (Signed)
?[facility] ?Provider Note ? ? ?CSN: [id number] ?Arrival date & time: 08/15/21  4481 ? ?  ? ?History ? ?Chief Complaint  ?Patient presents with  ? Arm Pain  ? ? ?Chelsea West is a 46 y.o. female. ? ?HPI ? ?Patient with medical history including diabetes presents with left arm pain.  Patient states that she went for a procedure last week and had an IV placed in her left arm.  She states a couple days after she will have pain in that left arm, she then felt a bump and hardness running up her arm, there is some bruising around the area but no overlying skin changes no paresthesias or weakness moving down her left arm, no fevers or chills.  Patient states she is never had this past she has no other complaints at this time.  She denies any leaving or aggravating factors. ? ?Home Medications ?Prior to Admission medications   ?Medication Sig Start Date End Date Taking? Authorizing Provider  ?amLODipine (NORVASC) 10 MG tablet Take 10 mg by mouth daily.   Yes [provider]  ?ferrous sulfate 325 (65 FE) MG EC tablet Take 325 mg by mouth 3 (three) times daily with meals.   Yes [provider]  ?metFORMIN (GLUCOPHAGE) 500 MG tablet Take 500 mg by mouth 2 (two) times daily with a meal.   Yes [provider]  ?omeprazole (PRILOSEC) 20 MG capsule Take 20 mg by mouth daily.   Yes [provider]  ?   ? ?Allergies    ?Penicillins   ? ?Review of Systems   ?Review of Systems  ?Constitutional:  Negative for chills and fever.  ?Respiratory:  Negative for shortness of breath.   ?Cardiovascular:  Negative for chest pain.  ?Gastrointestinal:  Negative for abdominal pain.  ?Skin:  Positive for color change.  ?Neurological:  Negative for headaches.  ? ?Physical Exam ?Updated Vital Signs ?BP (!) 136/96 (BP Location: Right Arm)   Pulse 73   Temp 98.4 ?F (36.9 ?C) (Oral)   Resp 16   Ht 5\' 2"  (1.575 m)   Wt 121.6 kg   LMP 07/18/2021   SpO2 100%   BMI 49.02 kg/m?  ?Physical  Exam ?Vitals and nursing note reviewed.  ?Constitutional:   ?   General: She is not in acute distress. ?   Appearance: Normal appearance. She is not ill-appearing or diaphoretic.  ?HENT:  ?   Head: Normocephalic and atraumatic.  ?   Nose: No congestion or rhinorrhea.  ?Eyes:  ?   Conjunctiva/sclera: Conjunctivae normal.  ?Cardiovascular:  ?   Rate and Rhythm: Normal rate and regular rhythm.  ?Pulmonary:  ?   Effort: Pulmonary effort is normal.  ?Musculoskeletal:  ?   Cervical back: Neck supple.  ?   Comments: Left arm was visualized she has some noted ecchymosis distally to the the medial epicondyle, no erythema not warm to the touch, tender to palpation she has a palpable cord and nodule, fixated but not spiculated.  2+ radial pulse.  ?Skin: ?   General: Skin is warm and dry.  ?   Coloration: Skin is not jaundiced or pale.  ?Neurological:  ?   Mental Status: She is alert and oriented to person, place, and time.  ?Psychiatric:     ?   Mood and Affect: Mood normal.  ? ? ?ED Results / Procedures / Treatments   ?Labs ?(all labs ordered are listed, but only abnormal results are displayed) ?Labs Reviewed -  No data to display ? ?EKG ?None ? ?Radiology ?US Venous Img Upper Uni Left ? ?Result Date: 08/15/2021 ?CLINICAL DATA:  Palpable cord involving the left antecubital fossa. Evaluate for DVT. EXAM: LEFT UPPER EXTREMITY VENOUS DOPPLER ULTRASOUND TECHNIQUE: Gray-scale sonography with graded compression, as well as color Doppler and duplex ultrasound were performed to evaluate the upper extremity deep venous system from the level of the subclavian vein and including the jugular, axillary, basilic, radial, ulnar and upper cephalic vein. Spectral Doppler was utilized to evaluate flow at rest and with distal augmentation maneuvers. COMPARISON:  None. FINDINGS: Contralateral Subclavian Vein: Respiratory phasicity is normal and symmetric with the symptomatic side. No evidence of thrombus. Normal compressibility. Internal Jugular  Vein: No evidence of thrombus. Normal compressibility, respiratory phasicity and response to augmentation. Subclavian Vein: No evidence of thrombus. Normal compressibility, respiratory phasicity and response to augmentation. Axillary Vein: No evidence of thrombus. Normal compressibility, respiratory phasicity and response to augmentation. Cephalic Vein: No evidence of thrombus. Normal compressibility, respiratory phasicity and response to augmentation. Basilic Vein: While the basilic vein appears widely patent proximally (image 39), there is hypoechoic occlusive thrombus involving the basilic vein at the level of the antecubital fossa and proximal forearm (images 40 through 48). The basilic vein appears patent more distally (image 51). Brachial Veins: No evidence of thrombus. Normal compressibility, respiratory phasicity and response to augmentation. Radial Veins: No evidence of thrombus. Normal compressibility, respiratory phasicity and response to augmentation. Ulnar Veins: No evidence of thrombus. Normal compressibility, respiratory phasicity and response to augmentation. Other Findings:  None visualized. IMPRESSION: 1. No evidence of DVT within the left upper extremity. 2. The examination is positive for short-segment occlusive superficial thrombophlebitis involving the basilic vein at the level of the antecubital fossa and proximal forearm. Electronically Signed   By: Simonne ComeJohn  Watts M.D.   On: 08/15/2021 10:47   ? ?Procedures ?Procedures  ? ? ?Medications Ordered in ED ?Medications - No data to display ? ?ED Course/ Medical Decision Making/ A&P ?  ?                        ?Medical Decision Making ? ?This patient presents to the ED for concern of left arm pain, this involves an extensive number of treatment options, and is a complaint that carries with it a high risk of complications and morbidity.  The differential diagnosis includes DVT, cellulitis, abscess ? ? ? ?Additional history obtained: ? ?Additional  history obtained from N/A ?External records from outside source obtained and reviewed including N/A ? ? ?Co morbidities that complicate the patient evaluation ? ?Diabetes ? ?Social Determinants of Health: ? ?N/A ? ? ? ?Lab Tests: ? ?I Ordered, and personally interpreted labs.  The pertinent results include: N/A ? ? ?Imaging Studies ordered: ? ?I ordered imaging studies including ultrasound study of left upper extremity ?I independently visualized and interpreted imaging which showed shows superficial thrombophlebitis ?I agree with the radiologist interpretation ? ? ?Cardiac Monitoring: ? ?The patient was maintained on a cardiac monitor.  I personally viewed and interpreted the cardiac monitored which showed an underlying rhythm of: N/A ? ? ?Medicines ordered and prescription drug management: ? ?I ordered medication including N/A ?I have reviewed the patients home medicines and have made adjustments as needed ? ?Critical Interventions: ? ?N/A ? ? ?Reevaluation: ? ?Presents with left arm pain, exam revealed palpable cord with some ecchymosis presents no overlying erythema lower suspicion for infection at this time likely inflammation surrounding the  vein itself.  Will obtain ultrasound to rule out of the DVT ? ?Patient updated on imaging she has no complaints is ready for discharge. ? ? ?Test Considered: ? ?CBC but will defer as my suspicion for infection very low at this time as she is nontoxic-appearing no sign of infection present on my exam ? ? ? ?Rule out ?DVT unlikely as ultrasound is negative for this finding.  Low suspicion for cellulitis and/or abscess as there is no erythema, no fluctuant duration present my exam. ? ? ? ?Dispostion and problem list ? ?After consideration of the diagnostic results and the patients response to treatment, I feel that the patent would benefit from discharge. ? ?Left arm pain-likely secondary due to thrombophlebitis recommend symptom management follow-up PCP as needed gave strict  return precautions. ? ? ? ? ? ? ? ? ? ? ? ?Final Clinical Impression(s) / ED Diagnoses ?Final diagnoses:  ?Thrombophlebitis  ? ? ?Rx / DC Orders ?ED Discharge Orders   ? ? None  ? ?  ? ? ?  ?Carroll Sage, PA-C

## 2021-08-15 NOTE — Discharge Instructions (Signed)
Imaging shows just some inflammation of your vein this will resolve on its own recommend anti-inflammatories like ibuprofen this will help with pain and swelling.  may also apply warm compress to the area  ? ?Please follow-up PCP as needed ? ?Come back to the emergency department if you develop chest pain, shortness of breath, severe abdominal pain, uncontrolled nausea, vomiting, diarrhea. ? ?

## 2021-08-15 NOTE — ED Triage Notes (Signed)
Patient c/o left lower/AC arm pain that started Last Thursday 3/16 after having an IV placed for a surgery. Per patient area of IV site tender and hard to touch as well as discoloration that is starting to spread up arm. Radial pulse present.  ?

## 2022-04-21 ENCOUNTER — Other Ambulatory Visit: Payer: Self-pay

## 2022-04-21 ENCOUNTER — Emergency Department (HOSPITAL_COMMUNITY)
Admission: EM | Admit: 2022-04-21 | Discharge: 2022-04-21 | Disposition: A | Discharge status: Home / Self Care | Payer: 59 | Attending: Emergency Medicine | Admitting: Emergency Medicine

## 2022-04-21 ENCOUNTER — Emergency Department (HOSPITAL_COMMUNITY): Payer: 59

## 2022-04-21 ENCOUNTER — Encounter (HOSPITAL_COMMUNITY): Payer: Self-pay | Admitting: *Deleted

## 2022-04-21 DIAGNOSIS — R197 Diarrhea, unspecified: Secondary | ICD-10-CM | POA: Diagnosis not present

## 2022-04-21 DIAGNOSIS — Z1152 Encounter for screening for COVID-19: Secondary | ICD-10-CM | POA: Insufficient documentation

## 2022-04-21 DIAGNOSIS — Z7984 Long term (current) use of oral hypoglycemic drugs: Secondary | ICD-10-CM | POA: Insufficient documentation

## 2022-04-21 DIAGNOSIS — I10 Essential (primary) hypertension: Secondary | ICD-10-CM | POA: Insufficient documentation

## 2022-04-21 DIAGNOSIS — R112 Nausea with vomiting, unspecified: Secondary | ICD-10-CM | POA: Diagnosis not present

## 2022-04-21 DIAGNOSIS — E119 Type 2 diabetes mellitus without complications: Secondary | ICD-10-CM | POA: Diagnosis not present

## 2022-04-21 DIAGNOSIS — Z79899 Other long term (current) drug therapy: Secondary | ICD-10-CM | POA: Diagnosis not present

## 2022-04-21 DIAGNOSIS — D649 Anemia, unspecified: Secondary | ICD-10-CM | POA: Diagnosis not present

## 2022-04-21 DIAGNOSIS — R519 Headache, unspecified: Secondary | ICD-10-CM | POA: Diagnosis not present

## 2022-04-21 LAB — URINALYSIS, ROUTINE W REFLEX MICROSCOPIC
Bilirubin Urine: NEGATIVE
Glucose, UA: NEGATIVE mg/dL
Hgb urine dipstick: NEGATIVE
Ketones, ur: 5 mg/dL — AB
Leukocytes,Ua: NEGATIVE
Nitrite: NEGATIVE
Protein, ur: 100 mg/dL — AB
Specific Gravity, Urine: 1.015 (ref 1.005–1.030)
pH: 8 (ref 5.0–8.0)

## 2022-04-21 LAB — CBC WITH DIFFERENTIAL/PLATELET
Abs Immature Granulocytes: 0.02 10*3/uL (ref 0.00–0.07)
Basophils Absolute: 0 10*3/uL (ref 0.0–0.1)
Basophils Relative: 1 %
Eosinophils Absolute: 0.1 10*3/uL (ref 0.0–0.5)
Eosinophils Relative: 1 %
HCT: 30.4 % — ABNORMAL LOW (ref 36.0–46.0)
Hemoglobin: 8.7 g/dL — ABNORMAL LOW (ref 12.0–15.0)
Immature Granulocytes: 0 %
Lymphocytes Relative: 40 %
Lymphs Abs: 2.5 10*3/uL (ref 0.7–4.0)
MCH: 19.8 pg — ABNORMAL LOW (ref 26.0–34.0)
MCHC: 28.6 g/dL — ABNORMAL LOW (ref 30.0–36.0)
MCV: 69.1 fL — ABNORMAL LOW (ref 80.0–100.0)
Monocytes Absolute: 0.7 10*3/uL (ref 0.1–1.0)
Monocytes Relative: 11 %
Neutro Abs: 2.9 10*3/uL (ref 1.7–7.7)
Neutrophils Relative %: 47 %
Platelets: 371 10*3/uL (ref 150–400)
RBC: 4.4 MIL/uL (ref 3.87–5.11)
RDW: 17.6 % — ABNORMAL HIGH (ref 11.5–15.5)
WBC: 6.2 10*3/uL (ref 4.0–10.5)
nRBC: 0 % (ref 0.0–0.2)

## 2022-04-21 LAB — COMPREHENSIVE METABOLIC PANEL
ALT: 9 U/L (ref 0–44)
AST: 17 U/L (ref 15–41)
Albumin: 3.7 g/dL (ref 3.5–5.0)
Alkaline Phosphatase: 64 U/L (ref 38–126)
Anion gap: 9 (ref 5–15)
BUN: 9 mg/dL (ref 6–20)
CO2: 22 mmol/L (ref 22–32)
Calcium: 8.9 mg/dL (ref 8.9–10.3)
Chloride: 105 mmol/L (ref 98–111)
Creatinine, Ser: 0.73 mg/dL (ref 0.44–1.00)
GFR, Estimated: >60 mL/min (ref >60)
Glucose, Bld: 84 mg/dL (ref 70–99)
Potassium: 3.9 mmol/L (ref 3.5–5.1)
Sodium: 136 mmol/L (ref 135–145)
Total Bilirubin: 1.5 mg/dL — ABNORMAL HIGH (ref 0.3–1.2)
Total Protein: 7.6 g/dL (ref 6.5–8.1)

## 2022-04-21 LAB — POC URINE PREG, ED: Preg Test, Ur: NEGATIVE

## 2022-04-21 LAB — RESP PANEL BY RT-PCR (RSV, FLU A&B, COVID)  RVPGX2
Influenza A by PCR: NEGATIVE
Influenza B by PCR: NEGATIVE
Resp Syncytial Virus by PCR: NEGATIVE
SARS Coronavirus 2 by RT PCR: NEGATIVE

## 2022-04-21 LAB — LIPASE, BLOOD: Lipase: 30 U/L (ref 11–51)

## 2022-04-21 MED ORDER — PROCHLORPERAZINE EDISYLATE 10 MG/2ML IJ SOLN
10.0000 mg | Freq: Once | INTRAMUSCULAR | Status: AC
  Administered 2022-04-21: 10 mg via INTRAVENOUS
  Filled 2022-04-21: qty 2

## 2022-04-21 MED ORDER — SODIUM CHLORIDE 0.9 % IV SOLN: INTRAVENOUS | Status: DC

## 2022-04-21 MED ORDER — ONDANSETRON HCL 4 MG/2ML IJ SOLN
4.0000 mg | Freq: Once | INTRAMUSCULAR | Status: AC
  Administered 2022-04-21: 4 mg via INTRAVENOUS
  Filled 2022-04-21: qty 2

## 2022-04-21 MED ORDER — DEXAMETHASONE SODIUM PHOSPHATE 10 MG/ML IJ SOLN
10.0000 mg | Freq: Once | INTRAMUSCULAR | Status: AC
  Administered 2022-04-21: 10 mg via INTRAVENOUS
  Filled 2022-04-21: qty 1

## 2022-04-21 MED ORDER — KETOROLAC TROMETHAMINE 30 MG/ML IJ SOLN: 15.0000 mg | Freq: Once | INTRAMUSCULAR | Status: DC

## 2022-04-21 MED ORDER — KETOROLAC TROMETHAMINE 15 MG/ML IJ SOLN
15.0000 mg | Freq: Once | INTRAMUSCULAR | Status: AC
  Administered 2022-04-21: 15 mg via INTRAVENOUS

## 2022-04-21 MED ORDER — DIPHENHYDRAMINE HCL 50 MG/ML IJ SOLN
12.5000 mg | Freq: Once | INTRAMUSCULAR | Status: AC
  Administered 2022-04-21: 12.5 mg via INTRAVENOUS
  Filled 2022-04-21: qty 1

## 2022-04-21 MED ORDER — ONDANSETRON HCL 4 MG PO TABS: 4.0000 mg | ORAL_TABLET | Freq: Four times a day (QID) | ORAL | 0 refills | Status: AC

## 2022-04-21 MED ORDER — SODIUM CHLORIDE 0.9 % IV BOLUS
1000.0000 mL | Freq: Once | INTRAVENOUS | Status: AC
  Administered 2022-04-21: 1000 mL via INTRAVENOUS

## 2022-04-21 MED ORDER — MORPHINE SULFATE (PF) 4 MG/ML IV SOLN
4.0000 mg | Freq: Once | INTRAVENOUS | Status: AC
  Administered 2022-04-21: 4 mg via INTRAVENOUS
  Filled 2022-04-21: qty 1

## 2022-04-21 MED ORDER — KETOROLAC TROMETHAMINE 15 MG/ML IJ SOLN
15.0000 mg | Freq: Once | INTRAMUSCULAR | Status: DC
  Filled 2022-04-21: qty 1

## 2022-04-21 NOTE — ED Triage Notes (Signed)
Pt in from home c/o HA onset yesterday at 2am with n/v/d, x 3 emesis episodes of vomiting in the last 24 hrs, and x5 loose stools in the last 24 hrs, denies abd pain, pt reports non productive cough onset yesterday and sore throat, A&O x4

## 2022-04-21 NOTE — ED Provider Notes (Cosign Needed Addendum)
[facility] Provider Note   CSN: [id number] Arrival date & time: 04/21/22  0813     History  No chief complaint on file.  HPI Chelsea West is a 46 y.o. female with hypertension, diabetes, and anemia presenting for cough, diarrhea vomiting and chills.  Symptoms started yesterday at 2 AM in the morning.  Began with a mild headache she vomited 1 time then felt better.  Headache returned and was worse started to have nausea and diarrhea. Noted to be the worst headache of her life.  Had some chills as well but no fever vomited once this morning.  States that some of the symptoms feel like when she had COVID last year.  Patient is not on a blood thinner.  Denies abdominal pain and sick contacts.  Has taken Alka-Seltzer and Tylenol Cold and flu for symptoms. Denies blood thinner use.   HPI     Home Medications Prior to Admission medications   Medication Sig Start Date End Date Taking? Authorizing Provider  ondansetron (ZOFRAN) 4 MG tablet Take 1 tablet (4 mg total) by mouth every 6 (six) hours. 04/21/22  Yes Harriet Pho, PA-C  amLODipine (NORVASC) 10 MG tablet Take 10 mg by mouth daily. Patient not taking: Reported on 04/21/2022    [provider]  ferrous sulfate 325 (65 FE) MG EC tablet Take 325 mg by mouth 3 (three) times daily with meals. Patient not taking: Reported on 04/21/2022    [provider]  metFORMIN (GLUCOPHAGE) 500 MG tablet Take 500 mg by mouth 2 (two) times daily with a meal. Patient not taking: Reported on 04/21/2022    [provider]  omeprazole (PRILOSEC) 20 MG capsule Take 20 mg by mouth daily. Patient not taking: Reported on 04/21/2022    [provider]      Allergies    Penicillins    Review of Systems   Review of Systems  Gastrointestinal:  Positive for diarrhea, nausea and vomiting.  Neurological:  Positive for headaches.    Physical Exam Updated Vital Signs BP (!) 147/87   Pulse 64    Temp 98 F (36.7 C) (Oral)   Resp 18   Ht 5\' 2"  (1.575 m)   LMP 04/06/2022 (Approximate)   SpO2 100%   BMI 49.02 kg/m  Physical Exam Vitals and nursing note reviewed.  HENT:     Head: Normocephalic and atraumatic.     Mouth/Throat:     Mouth: Mucous membranes are moist.  Eyes:     General:        Right eye: No discharge.        Left eye: No discharge.     Conjunctiva/sclera: Conjunctivae normal.  Cardiovascular:     Rate and Rhythm: Normal rate and regular rhythm.     Pulses: Normal pulses.     Heart sounds: Normal heart sounds.  Pulmonary:     Effort: Pulmonary effort is normal.     Breath sounds: Normal breath sounds.  Abdominal:     General: Abdomen is flat.     Palpations: Abdomen is soft.  Skin:    General: Skin is warm and dry.  Neurological:     General: No focal deficit present.     Comments: GCS 15. Speech is goal oriented. No deficits appreciated to CN III-XII; symmetric eyebrow raise, no facial drooping, tongue midline. Patient has equal grip strength bilaterally with 5/5 strength against resistance in all major muscle groups bilaterally. Sensation to light touch  intact. Patient moves extremities without ataxia. Normal finger-nose-finger. Patient ambulatory with steady gait.   Psychiatric:        Mood and Affect: Mood normal.     ED Results / Procedures / Treatments   Labs (all labs ordered are listed, but only abnormal results are displayed) Labs Reviewed  CBC WITH DIFFERENTIAL/PLATELET - Abnormal; Notable for the following components:      Result Value   Hemoglobin 8.7 (*)    HCT 30.4 (*)    MCV 69.1 (*)    MCH 19.8 (*)    MCHC 28.6 (*)    RDW 17.6 (*)    All other components within normal limits  COMPREHENSIVE METABOLIC PANEL - Abnormal; Notable for the following components:   Total Bilirubin 1.5 (*)    All other components within normal limits  URINALYSIS, ROUTINE W REFLEX MICROSCOPIC - Abnormal; Notable for the following components:    APPearance HAZY (*)    Ketones, ur 5 (*)    Protein, ur 100 (*)    Bacteria, UA RARE (*)    All other components within normal limits  RESP PANEL BY RT-PCR (RSV, FLU A&B, COVID)  RVPGX2  LIPASE, BLOOD  POC URINE PREG, ED    EKG None  Radiology CT Head Wo Contrast  Result Date: 04/21/2022 CLINICAL DATA:  Headache. Increasing frequency and severity. Nausea and vomiting. EXAM: CT HEAD WITHOUT CONTRAST TECHNIQUE: Contiguous axial images were obtained from the base of the skull through the vertex without intravenous contrast. RADIATION DOSE REDUCTION: This exam was performed according to the departmental dose-optimization program which includes automated exposure control, adjustment of the mA and/or kV according to patient size and/or use of iterative reconstruction technique. COMPARISON:  None Available. FINDINGS: Brain: The brain shows a normal appearance without evidence of malformation, atrophy, old or acute small or large vessel infarction, mass lesion, hemorrhage, hydrocephalus or extra-axial collection. Vascular: No hyperdense vessel. No evidence of atherosclerotic calcification. Skull: Normal.  No traumatic finding.  No focal bone lesion. Sinuses/Orbits: Sinuses are clear. Orbits appear normal. Mastoids are clear. Other: None significant IMPRESSION: Normal head CT Electronically Signed   By: Paulina Fusi M.D.   On: 04/21/2022 14:21    Procedures Procedures    Medications Ordered in ED Medications  sodium chloride 0.9 % bolus 1,000 mL (1,000 mLs Intravenous New Bag/Given 04/21/22 1232)    And  0.9 %  sodium chloride infusion (has no administration in time range)  ondansetron (ZOFRAN) injection 4 mg (4 mg Intravenous Given 04/21/22 0944)  ketorolac (TORADOL) 15 MG/ML injection 15 mg (15 mg Intravenous Given 04/21/22 0946)  morphine (PF) 4 MG/ML injection 4 mg (4 mg Intravenous Given 04/21/22 1232)  diphenhydrAMINE (BENADRYL) injection 12.5 mg (12.5 mg Intravenous Given 04/21/22 1230)   dexamethasone (DECADRON) injection 10 mg (10 mg Intravenous Given 04/21/22 1232)  prochlorperazine (COMPAZINE) injection 10 mg (10 mg Intravenous Given 04/21/22 1446)    ED Course/ Medical Decision Making/ A&P                           Medical Decision Making Amount and/or Complexity of Data Reviewed Labs: ordered. Radiology: ordered.  Risk Prescription drug management.   Initial Impression and Ddx Patient is a well-appearing nontoxic 46 year old female presenting for chills diarrhea nausea vomiting and headache.  Differential diagnosis for this complaint includes covid, flu, SAH, hypertensive emergency. Patient PMH that increases complexity of ED encounter: Hypertension, diabetes and anemia  Interpretation of Diagnostics  I independent reviewed and interpreted the labs as followed: Anemia, hyperbilirubinemia   - I independently visualized the following imaging with scope of interpretation limited to determining acute life threatening conditions related to emergency care: Head CT which did not reveal any abnormal findings.  Patient Reassessment and Ultimate Disposition/Management Treated headache with headache cocktail and fluids and upon reevaluation patient stated that her symptoms have improved. Treated nausea with zofran. Did consider intracranial pathology given that headache is the worst of her life but reassured that headache onset was insidious and mild and progressively worsened over time indicating likely not acute intracranial hemorrhage.  CT scan also unremarkable.  Considered flu COVID and RSV but unlikely given negative PCR.  Symptoms consistent with a viral gastroenteritis.  Headache could be related to hypovolemia secondary to significant vomiting diarrhea in the last couple days.  Patient management required discussion with the following services or consulting groups:  None  Complexity of Problems Addressed Acute complicated illness or Injury  Additional Data  Reviewed and Analyzed Further history obtained from: Prior ED visit notes and Recent PCP notes  Patient Encounter Risk Assessment Prescriptions         Final Clinical Impression(s) / ED Diagnoses Final diagnoses:  Nonintractable headache, unspecified chronicity pattern, unspecified headache type  Nausea and vomiting, unspecified vomiting type  Diarrhea, unspecified type    Rx / DC Orders ED Discharge Orders          Ordered    ondansetron (ZOFRAN) 4 MG tablet  Every 6 hours        04/21/22 1604              Harriet Pho, PA-C 04/21/22 [place], Laurabelle Gorczyca K, PA-C 04/21/22 1605    Cristie Hem, MD 04/22/22 [phone]

## 2022-04-21 NOTE — ED Notes (Signed)
Generalized body aches and nausea improved slightly over the past 2 hours, but are now back to initial severity. Pt vomited within the past 5 minutes.

## 2022-04-21 NOTE — Discharge Instructions (Signed)
Evaluation of your headache and nausea vomiting diarrhea is overall reassuring.  CT of your head did not reveal any acute process and was otherwise normal.  Nausea vomiting diarrhea is likely result of a viral gastroenteritis and will get better with time.  Recommend that he continue good hydration and a bland diet and can advance as tolerated.  He can take Zofran as needed for nausea.  If you have new abdominal pain, new fever, intractable vomiting diarrhea please return to the emergency department for evaluation.

## 2022-07-19 ENCOUNTER — Emergency Department (HOSPITAL_COMMUNITY)
Admission: EM | Admit: 2022-07-19 | Discharge: 2022-07-19 | Disposition: A | Discharge status: Home / Self Care | Payer: 59 | Attending: Emergency Medicine | Admitting: Emergency Medicine

## 2022-07-19 ENCOUNTER — Encounter (HOSPITAL_COMMUNITY): Payer: Self-pay | Admitting: Emergency Medicine

## 2022-07-19 DIAGNOSIS — Z1152 Encounter for screening for COVID-19: Secondary | ICD-10-CM | POA: Diagnosis not present

## 2022-07-19 DIAGNOSIS — D649 Anemia, unspecified: Secondary | ICD-10-CM | POA: Insufficient documentation

## 2022-07-19 DIAGNOSIS — R42 Dizziness and giddiness: Secondary | ICD-10-CM | POA: Diagnosis present

## 2022-07-19 DIAGNOSIS — E119 Type 2 diabetes mellitus without complications: Secondary | ICD-10-CM | POA: Insufficient documentation

## 2022-07-19 DIAGNOSIS — I10 Essential (primary) hypertension: Secondary | ICD-10-CM | POA: Insufficient documentation

## 2022-07-19 LAB — COMPREHENSIVE METABOLIC PANEL
ALT: 8 U/L (ref 0–44)
AST: 16 U/L (ref 15–41)
Albumin: 4 g/dL (ref 3.5–5.0)
Alkaline Phosphatase: 55 U/L (ref 38–126)
Anion gap: 9 (ref 5–15)
BUN: 14 mg/dL (ref 6–20)
CO2: 24 mmol/L (ref 22–32)
Calcium: 8.9 mg/dL (ref 8.9–10.3)
Chloride: 105 mmol/L (ref 98–111)
Creatinine, Ser: 0.72 mg/dL (ref 0.44–1.00)
GFR, Estimated: >60 mL/min (ref >60)
Glucose, Bld: 79 mg/dL (ref 70–99)
Potassium: 3.6 mmol/L (ref 3.5–5.1)
Sodium: 138 mmol/L (ref 135–145)
Total Bilirubin: 1.7 mg/dL — ABNORMAL HIGH (ref 0.3–1.2)
Total Protein: 7.9 g/dL (ref 6.5–8.1)

## 2022-07-19 LAB — RESP PANEL BY RT-PCR (RSV, FLU A&B, COVID)  RVPGX2
Influenza A by PCR: NEGATIVE
Influenza B by PCR: NEGATIVE
Resp Syncytial Virus by PCR: NEGATIVE
SARS Coronavirus 2 by RT PCR: NEGATIVE

## 2022-07-19 LAB — CBC WITH DIFFERENTIAL/PLATELET
Abs Immature Granulocytes: 0.01 10*3/uL (ref 0.00–0.07)
Basophils Absolute: 0 10*3/uL (ref 0.0–0.1)
Basophils Relative: 1 %
Eosinophils Absolute: 0 10*3/uL (ref 0.0–0.5)
Eosinophils Relative: 1 %
HCT: 27.4 % — ABNORMAL LOW (ref 36.0–46.0)
Hemoglobin: 7.6 g/dL — ABNORMAL LOW (ref 12.0–15.0)
Immature Granulocytes: 0 %
Lymphocytes Relative: 24 %
Lymphs Abs: 1.4 10*3/uL (ref 0.7–4.0)
MCH: 17.9 pg — ABNORMAL LOW (ref 26.0–34.0)
MCHC: 27.7 g/dL — ABNORMAL LOW (ref 30.0–36.0)
MCV: 64.5 fL — ABNORMAL LOW (ref 80.0–100.0)
Monocytes Absolute: 0.6 10*3/uL (ref 0.1–1.0)
Monocytes Relative: 9 %
Neutro Abs: 3.9 10*3/uL (ref 1.7–7.7)
Neutrophils Relative %: 65 %
Platelets: 336 10*3/uL (ref 150–400)
RBC: 4.25 MIL/uL (ref 3.87–5.11)
RDW: 20.2 % — ABNORMAL HIGH (ref 11.5–15.5)
WBC: 6 10*3/uL (ref 4.0–10.5)
nRBC: 0 % (ref 0.0–0.2)

## 2022-07-19 MED ORDER — SODIUM CHLORIDE 0.9 % IV BOLUS
1000.0000 mL | Freq: Once | INTRAVENOUS | Status: AC
  Administered 2022-07-19: 1000 mL via INTRAVENOUS

## 2022-07-19 NOTE — ED Triage Notes (Addendum)
Pt has hx of gastric sleeve. She does not eat much. States she has been dizzy since yesterday and took BP at home and it was 88/63. She states she had another episode today. BP is 148/85. Pt denies LOC. Hx of anemia. Takes iron supplements 3 times day.

## 2022-07-19 NOTE — Discharge Instructions (Addendum)
You are seen today for dizziness, I think your lightheadedness is secondary to dehydration.  Drink plenty of fluids, you also are very anemic.  Follow-up with your primary care doctor regarding this and continue taking your iron supplements.  Return to the ED for new or concerning symptoms.

## 2022-07-19 NOTE — ED Provider Notes (Signed)
[facility] Provider Note   CSN: [id number] Arrival date & time: 07/19/22  1326     History  Chief Complaint  Patient presents with   Dizziness    Chelsea West is a 47 y.o. female.   Dizziness    Patient with medical history of chronic bronchitis, diabetes, hypertension, status post gastric sleeve, chronic anemia presents to the emergency department due to lightheadedness.  Started yesterday, initially it happened 3 times yesterday when she went from sitting to standing she folic she was lightheaded and that the room was spinning.  This resolved with rest.  This morning she felt fine but while at work she felt lightheaded again, she checked her blood pressure and it was low at 88 systolically, called her primary care doctor advised him to the ED.  Patient denies any recent changes in medication, states she does not drink or eat as much as the gastric sleeve last year and sometimes she can get dehydrated without realizing it.  She has had some nasal congestion and a nonproductive cough for the last few days, denies any chest pain or shortness of breath.  She has not had any dark or tarry stool.  Home Medications Prior to Admission medications   Medication Sig Start Date End Date Taking? Authorizing Provider  amLODipine (NORVASC) 10 MG tablet Take 10 mg by mouth daily. Patient not taking: Reported on 04/21/2022    [provider]  ferrous sulfate 325 (65 FE) MG EC tablet Take 325 mg by mouth 3 (three) times daily with meals. Patient not taking: Reported on 04/21/2022    [provider]  metFORMIN (GLUCOPHAGE) 500 MG tablet Take 500 mg by mouth 2 (two) times daily with a meal. Patient not taking: Reported on 04/21/2022    [provider]  omeprazole (PRILOSEC) 20 MG capsule Take 20 mg by mouth daily. Patient not taking: Reported on 04/21/2022    [provider]  ondansetron (ZOFRAN) 4 MG tablet Take 1  tablet (4 mg total) by mouth every 6 (six) hours. 04/21/22   Harriet Pho, PA-C      Allergies    Penicillins    Review of Systems   Review of Systems  Neurological:  Positive for dizziness.    Physical Exam Updated Vital Signs BP (!) 140/88   Pulse (!) 55   Temp 98.6 F (37 C) (Oral)   Resp 18   SpO2 100%  Physical Exam Vitals and nursing note reviewed. Exam conducted with a chaperone present.  Constitutional:      Appearance: Normal appearance.  HENT:     Head: Normocephalic and atraumatic.     Mouth/Throat:     Mouth: Mucous membranes are dry.  Eyes:     General: No scleral icterus.       Right eye: No discharge.        Left eye: No discharge.     Extraocular Movements: Extraocular movements intact.     Pupils: Pupils are equal, round, and reactive to light.  Cardiovascular:     Rate and Rhythm: Normal rate and regular rhythm.     Pulses: Normal pulses.     Heart sounds: Normal heart sounds. No murmur heard.    No friction rub. No gallop.     Comments: Upper and lower extremity pulses are symmetric bilaterally Pulmonary:     Effort: Pulmonary effort is normal. No respiratory distress.     Breath sounds: Normal breath sounds.  Chest:     Chest wall: No tenderness.  Abdominal:     General: Abdomen is flat. Bowel sounds are normal. There is no distension.     Palpations: Abdomen is soft.     Tenderness: There is no abdominal tenderness.  Skin:    General: Skin is warm and dry.     Coloration: Skin is not jaundiced.  Neurological:     Mental Status: She is alert. Mental status is at baseline.     Coordination: Coordination normal.     Comments: Cranial nerves II to XII are grossly, PERRLA extremity strength symmetric bilaterally     ED Results / Procedures / Treatments   Labs (all labs ordered are listed, but only abnormal results are displayed) Labs Reviewed  COMPREHENSIVE METABOLIC PANEL - Abnormal; Notable for the following components:      Result  Value   Total Bilirubin 1.7 (*)    All other components within normal limits  CBC WITH DIFFERENTIAL/PLATELET - Abnormal; Notable for the following components:   Hemoglobin 7.6 (*)    HCT 27.4 (*)    MCV 64.5 (*)    MCH 17.9 (*)    MCHC 27.7 (*)    RDW 20.2 (*)    All other components within normal limits  RESP PANEL BY RT-PCR (RSV, FLU A&B, COVID)  [id number]    EKG None  Radiology No results found.  Procedures Procedures    Medications Ordered in ED Medications  sodium chloride 0.9 % bolus 1,000 mL (1,000 mLs Intravenous New Bag/Given 07/19/22 1817)    ED Course/ Medical Decision Making/ A&P                             Medical Decision Making Amount and/or Complexity of Data Reviewed Labs: ordered.   Patient presents due to dizziness.  Differential includes not limited to symptomatic anemia, orthostatic hypotension, electrolyte derangement, dehydration, AKI, arrhythmia.  On exam patient is well-appearing, blood pressure has been stable throughout the ED although she was hypotensive at home.  She is afebrile there is no SIRS criteria, will check laboratory workup, EKG and reassess.  History and presentation seem most consistent with orthostatic hypotension, she also has a history of anemia so definitely need to check a CBC.  CBC is notable for anemia with hemoglobin of 7.7, not grossly changed compared to previous and not low enough to require blood transfusion.  There is no gross electrolyte derangement or AKI, COVID and flu are negative, EKG shows sinus rhythm without underlying arrhythmia or heart block.  I ordered a liter fluid bolus, reevaluate the patient who is feeling improved.  Orthostatic vital signs are stable without any significant hypotension, I do not think she stay in the hospital for additional workup at this time.  Discussed return precautions with patient verbalized understanding.  She will continue taking her iron supplements and following up with her PCP.   Stable for outpatient follow-up at this time.        Final Clinical Impression(s) / ED Diagnoses Final diagnoses:  Dizziness  Symptomatic anemia    Rx / DC Orders ED Discharge Orders     None         Sherrill Raring, Hershal Coria 07/19/22 1859    Noemi Chapel, MD 07/19/22 [phone]

## 2022-08-12 ENCOUNTER — Encounter: Payer: Self-pay | Admitting: Oncology

## 2022-08-12 ENCOUNTER — Inpatient Hospital Stay: Payer: Self-pay | Attending: Oncology | Admitting: Oncology

## 2022-08-12 ENCOUNTER — Inpatient Hospital Stay: Payer: Self-pay

## 2022-08-12 VITALS — BP 159/100 | HR 69 | Temp 97.0°F | Resp 16 | Ht 62.0 in | Wt 175.0 lb

## 2022-08-12 DIAGNOSIS — D509 Iron deficiency anemia, unspecified: Secondary | ICD-10-CM | POA: Insufficient documentation

## 2022-08-12 DIAGNOSIS — Z79899 Other long term (current) drug therapy: Secondary | ICD-10-CM | POA: Insufficient documentation

## 2022-08-12 DIAGNOSIS — Z9884 Bariatric surgery status: Secondary | ICD-10-CM | POA: Insufficient documentation

## 2022-08-12 LAB — IRON AND TIBC
Iron: 14 ug/dL — ABNORMAL LOW (ref 28–170)
Saturation Ratios: 3 % — ABNORMAL LOW (ref 10.4–31.8)
TIBC: 550 ug/dL — ABNORMAL HIGH (ref 250–450)
UIBC: 536 ug/dL

## 2022-08-12 LAB — FERRITIN: Ferritin: 4 ng/mL — ABNORMAL LOW (ref 11–307)

## 2022-08-12 LAB — CBC (CANCER CENTER ONLY)
HCT: 26.7 % — ABNORMAL LOW (ref 36.0–46.0)
Hemoglobin: 7.4 g/dL — ABNORMAL LOW (ref 12.0–15.0)
MCH: 17.3 pg — ABNORMAL LOW (ref 26.0–34.0)
MCHC: 27.7 g/dL — ABNORMAL LOW (ref 30.0–36.0)
MCV: 62.4 fL — ABNORMAL LOW (ref 80.0–100.0)
Platelet Count: 338 10*3/uL (ref 150–400)
RBC: 4.28 MIL/uL (ref 3.87–5.11)
RDW: 20 % — ABNORMAL HIGH (ref 11.5–15.5)
WBC Count: 4.6 10*3/uL (ref 4.0–10.5)
nRBC: 0 % (ref 0.0–0.2)

## 2022-08-12 LAB — FOLATE: Folate: 6.8 ng/mL (ref >5.9)

## 2022-08-12 NOTE — Progress Notes (Signed)
Having weakness and tiredness. Feels faint at times. Had gastric sleeve in March of 2023. Appetite is slim. Taking OTC ferrous sulfate.

## 2022-08-12 NOTE — Progress Notes (Signed)
[name]  [phone] [phone]  ID: Chelsea West OB: [birth year]  MR#: [id number]  [id number]  Patient Care Team: Gladstone Lighter, MD as PCP - General (Internal Medicine)  CHIEF COMPLAINT: Iron deficiency anemia.  INTERVAL HISTORY: Patient is a 47 year old female with history of gastric sleeve who was noted to have a declining hemoglobin on routine blood work.  She has weakness and fatigue, but otherwise feels well.  She has no neurologic complaints.  She denies any recent fevers or illnesses.  She has a good appetite and denies she has no chest pain, shortness of breath, cough, or hemoptysis.  She denies any nausea, vomiting, constipation, or diarrhea.  She has no known or hematochezia.  She has no urinary complaints.  Patient offers no further specific complaints today.  REVIEW OF SYSTEMS:   Review of Systems  Constitutional:  Positive for malaise/fatigue. Negative for fever and weight loss.  Respiratory: Negative.  Negative for cough, hemoptysis and shortness of breath.   Cardiovascular: Negative.  Negative for chest pain and leg swelling.  Gastrointestinal:  Negative for abdominal pain, blood in stool and melena.  Genitourinary: Negative.  Negative for hematuria.  Musculoskeletal: Negative.  Negative for back pain and myalgias.  Skin: Negative.  Negative for rash.  Neurological:  Positive for weakness. Negative for dizziness, focal weakness and headaches.  Psychiatric/Behavioral: Negative.  The patient is not nervous/anxious.     As per HPI. Otherwise, a complete review of systems is negative.  PAST MEDICAL HISTORY: Past Medical History:  Diagnosis Date   Anemia    Bronchitis, chronic (HCC)    Diabetes mellitus without complication ([facility])    Hypertension    Tubal pregnancy     PAST SURGICAL HISTORY: Past Surgical History:  Procedure Laterality Date   LAPAROSCOPIC GASTRIC SLEEVE RESECTION     SALPINGOSTOMY       FAMILY HISTORY: History reviewed. No pertinent family history.  ADVANCED DIRECTIVES (Y/N):  N  HEALTH MAINTENANCE: Social History   Tobacco Use   Smoking status: Never   Smokeless tobacco: Never  Vaping Use   Vaping Use: Never used  Substance Use Topics   Alcohol use: No   Drug use: No     Colonoscopy:  PAP:  Bone density:  Lipid panel:  Allergies  Allergen Reactions   Penicillins Shortness Of Breath    Current Outpatient Medications  Medication Sig Dispense Refill   ferrous sulfate 325 (65 FE) MG EC tablet Take 325 mg by mouth 3 (three) times daily with meals.     ondansetron (ZOFRAN) 4 MG tablet Take 1 tablet (4 mg total) by mouth every 6 (six) hours. 12 tablet 0   Vitamin D, Ergocalciferol, (DRISDOL) 1.25 MG (50000 UNIT) CAPS capsule Take 50,000 Units by mouth every 7 (seven) days.     amLODipine (NORVASC) 10 MG tablet Take 10 mg by mouth daily. (Patient not taking: Reported on 04/21/2022)     metFORMIN (GLUCOPHAGE) 500 MG tablet Take 500 mg by mouth 2 (two) times daily with a meal. (Patient not taking: Reported on 04/21/2022)     omeprazole (PRILOSEC) 20 MG capsule Take 20 mg by mouth daily. (Patient not taking: Reported on 04/21/2022)     No current facility-administered medications for this visit.    OBJECTIVE: Vitals:   08/12/22 1114  BP: (!) 159/100  Pulse: 69  Resp: 16  Temp: (!) 97 F (36.1 C)  SpO2: 100%     Body mass index is 32.01 kg/m.  ECOG FS:0 - Asymptomatic  General: Well-developed, well-nourished, no acute distress. Eyes: Pink conjunctiva, anicteric sclera. HEENT: Normocephalic, moist mucous membranes. Lungs: No audible wheezing or coughing. Heart: Regular rate and rhythm. Abdomen: Soft, nontender, no obvious distention. Musculoskeletal: No edema, cyanosis, or clubbing. Neuro: Alert, answering all questions appropriately. Cranial nerves grossly intact. Skin: No rashes or petechiae noted. Psych: Normal affect. Lymphatics: No  cervical, calvicular, axillary or inguinal LAD.   LAB RESULTS:  Lab Results  Component Value Date   NA 138 07/19/2022   K 3.6 07/19/2022   CL 105 07/19/2022   CO2 24 07/19/2022   GLUCOSE 79 07/19/2022   BUN 14 07/19/2022   CREATININE 0.72 07/19/2022   CALCIUM 8.9 07/19/2022   PROT 7.9 07/19/2022   ALBUMIN 4.0 07/19/2022   AST 16 07/19/2022   ALT 8 07/19/2022   ALKPHOS 55 07/19/2022   BILITOT 1.7 (H) 07/19/2022   GFRNONAA >60 07/19/2022   GFRAA 53 (L) 05/16/2019    Lab Results  Component Value Date   WBC 4.6 08/12/2022   NEUTROABS 3.9 07/19/2022   HGB 7.4 (L) 08/12/2022   HCT 26.7 (L) 08/12/2022   MCV 62.4 (L) 08/12/2022   PLT 338 08/12/2022   No results found for: "IRON", "TIBC", "IRONPCTSAT"  No results found for: "FERRITIN"   STUDIES: No results found.  ASSESSMENT: Iron deficiency anemia.  PLAN:    Iron deficiency anemia: Patient has a history of gastric sleeve that is likely contributing.  Her most recent hemoglobin is 7.4.  Iron stores, B12, folate, and hemoglobinopathy profile are all pending at time of dictation.  She has been instructed to continue oral iron supplementation.  Patient will benefit from 200 mg of IV Venofer.  Return to clinic 5 times over the next 3 weeks for treatment.  Patient will then return to clinic 3 months with repeat laboratory work, further evaluation, and continuation of treatment if needed.  I spent a total of 45 minutes reviewing chart data, face-to-face evaluation with the patient, counseling and coordination of care as detailed above.  Patient expressed understanding and was in agreement with this plan. She also understands that She can call clinic at any time with any questions, concerns, or complaints.     Lloyd Huger, MD   08/12/2022 1:06 PM

## 2022-08-13 LAB — VITAMIN B12: Vitamin B-12: 416 pg/mL (ref 180–914)

## 2022-08-13 MED FILL — Iron Sucrose Inj 20 MG/ML (Fe Equiv): INTRAVENOUS | Qty: 10 | Status: AC

## 2022-08-16 ENCOUNTER — Inpatient Hospital Stay: Payer: Self-pay

## 2022-08-16 VITALS — BP 132/76 | HR 72 | Temp 97.6°F

## 2022-08-16 DIAGNOSIS — D509 Iron deficiency anemia, unspecified: Secondary | ICD-10-CM

## 2022-08-16 LAB — HGB FRACTIONATION CASCADE
Hgb A2: 2.1 % (ref 1.8–3.2)
Hgb A: 97.9 % (ref 96.4–98.8)
Hgb F: 0 % (ref 0.0–2.0)
Hgb S: 0 %

## 2022-08-16 MED ORDER — SODIUM CHLORIDE 0.9 % IV SOLN
Freq: Once | INTRAVENOUS | Status: AC
  Filled 2022-08-16: qty 250

## 2022-08-16 MED ORDER — SODIUM CHLORIDE 0.9 % IV SOLN
200.0000 mg | Freq: Once | INTRAVENOUS | Status: AC
  Administered 2022-08-16: 200 mg via INTRAVENOUS
  Filled 2022-08-16: qty 200

## 2022-08-19 ENCOUNTER — Inpatient Hospital Stay: Payer: Self-pay

## 2022-08-19 VITALS — BP 130/72 | HR 72 | Temp 97.3°F | Resp 16

## 2022-08-19 DIAGNOSIS — D509 Iron deficiency anemia, unspecified: Secondary | ICD-10-CM

## 2022-08-19 MED ORDER — SODIUM CHLORIDE 0.9 % IV SOLN
Freq: Once | INTRAVENOUS | Status: AC
  Filled 2022-08-19: qty 250

## 2022-08-19 MED ORDER — SODIUM CHLORIDE 0.9 % IV SOLN
200.0000 mg | Freq: Once | INTRAVENOUS | Status: AC
  Administered 2022-08-19: 200 mg via INTRAVENOUS
  Filled 2022-08-19: qty 200

## 2022-08-23 ENCOUNTER — Inpatient Hospital Stay: Payer: Self-pay | Attending: Oncology

## 2022-08-23 VITALS — BP 139/91 | HR 65 | Temp 96.4°F | Resp 18

## 2022-08-23 DIAGNOSIS — D509 Iron deficiency anemia, unspecified: Secondary | ICD-10-CM | POA: Insufficient documentation

## 2022-08-23 DIAGNOSIS — Z79899 Other long term (current) drug therapy: Secondary | ICD-10-CM | POA: Insufficient documentation

## 2022-08-23 MED ORDER — SODIUM CHLORIDE 0.9 % IV SOLN
200.0000 mg | Freq: Once | INTRAVENOUS | Status: AC
  Administered 2022-08-23: 200 mg via INTRAVENOUS
  Filled 2022-08-23: qty 200

## 2022-08-23 MED ORDER — SODIUM CHLORIDE 0.9 % IV SOLN
Freq: Once | INTRAVENOUS | Status: AC
  Filled 2022-08-23: qty 250

## 2022-08-23 NOTE — Patient Instructions (Signed)

## 2022-08-24 ENCOUNTER — Telehealth: Payer: Self-pay | Admitting: *Deleted

## 2022-08-24 NOTE — Telephone Encounter (Addendum)
Patient called reporting that she awoke this morning with her nose bleeding and is asking if this can be from the infusions she has been getting (Iron) or not. It did not last long. She states that she had 2 episodes of dizziness Sat where she felt she was going to pass out, she sat down and after 2 hours it went away an day she has not had any more since then. She has another infusion Thursday (third infusion) Denies fevers, she is eating and drinking normally. Please advise.

## 2022-08-24 NOTE — Telephone Encounter (Signed)
Patient states that she will call her PCP about it and declines [facility] offer

## 2022-08-26 ENCOUNTER — Inpatient Hospital Stay: Payer: Self-pay

## 2022-08-26 ENCOUNTER — Other Ambulatory Visit: Payer: Self-pay | Admitting: *Deleted

## 2022-08-26 VITALS — BP 128/79 | HR 67 | Temp 97.0°F | Resp 16

## 2022-08-26 DIAGNOSIS — D509 Iron deficiency anemia, unspecified: Secondary | ICD-10-CM

## 2022-08-26 MED ORDER — SODIUM CHLORIDE 0.9 % IV SOLN
200.0000 mg | Freq: Once | INTRAVENOUS | Status: AC
  Administered 2022-08-26: 200 mg via INTRAVENOUS
  Filled 2022-08-26: qty 200

## 2022-08-26 MED ORDER — SODIUM CHLORIDE 0.9 % IV SOLN
Freq: Once | INTRAVENOUS | Status: AC
  Filled 2022-08-26: qty 250

## 2022-08-26 NOTE — Progress Notes (Signed)
Pt refused 30 minute post observation following iron infusion.  VSS, no complaints.

## 2022-08-26 NOTE — Patient Instructions (Signed)

## 2022-08-30 ENCOUNTER — Inpatient Hospital Stay: Payer: Self-pay

## 2022-08-30 VITALS — BP 125/91 | HR 72 | Temp 98.0°F | Resp 16

## 2022-08-30 DIAGNOSIS — D509 Iron deficiency anemia, unspecified: Secondary | ICD-10-CM

## 2022-08-30 MED ORDER — SODIUM CHLORIDE 0.9 % IV SOLN
200.0000 mg | Freq: Once | INTRAVENOUS | Status: AC
  Administered 2022-08-30: 200 mg via INTRAVENOUS
  Filled 2022-08-30: qty 200

## 2022-08-30 MED ORDER — SODIUM CHLORIDE 0.9 % IV SOLN
Freq: Once | INTRAVENOUS | Status: AC
  Filled 2022-08-30: qty 250

## 2022-08-30 NOTE — Progress Notes (Signed)
Pt has been educated and understands. Pt declined to stay 30 mins after iron infusion. VSS.  

## 2022-09-01 MED FILL — Iron Sucrose Inj 20 MG/ML (Fe Equiv): INTRAVENOUS | Qty: 10 | Status: AC

## 2022-09-02 ENCOUNTER — Inpatient Hospital Stay: Payer: Self-pay

## 2022-09-02 DIAGNOSIS — D509 Iron deficiency anemia, unspecified: Secondary | ICD-10-CM

## 2022-09-02 LAB — CBC WITH DIFFERENTIAL/PLATELET
Abs Immature Granulocytes: 0.02 10*3/uL (ref 0.00–0.07)
Basophils Absolute: 0 10*3/uL (ref 0.0–0.1)
Basophils Relative: 0 %
Eosinophils Absolute: 0.1 10*3/uL (ref 0.0–0.5)
Eosinophils Relative: 2 %
HCT: 33 % — ABNORMAL LOW (ref 36.0–46.0)
Hemoglobin: 9.5 g/dL — ABNORMAL LOW (ref 12.0–15.0)
Immature Granulocytes: 0 %
Lymphocytes Relative: 34 %
Lymphs Abs: 1.8 10*3/uL (ref 0.7–4.0)
MCH: 21.3 pg — ABNORMAL LOW (ref 26.0–34.0)
MCHC: 28.8 g/dL — ABNORMAL LOW (ref 30.0–36.0)
MCV: 73.8 fL — ABNORMAL LOW (ref 80.0–100.0)
Monocytes Absolute: 0.6 10*3/uL (ref 0.1–1.0)
Monocytes Relative: 12 %
Neutro Abs: 2.7 10*3/uL (ref 1.7–7.7)
Neutrophils Relative %: 52 %
Platelets: 496 10*3/uL — ABNORMAL HIGH (ref 150–400)
RBC: 4.47 MIL/uL (ref 3.87–5.11)
Smear Review: ADEQUATE
WBC: 5.2 10*3/uL (ref 4.0–10.5)
nRBC: 0 % (ref 0.0–0.2)

## 2022-09-02 LAB — SAMPLE TO BLOOD BANK

## 2022-09-03 ENCOUNTER — Telehealth: Payer: Self-pay | Admitting: *Deleted

## 2022-09-03 NOTE — Telephone Encounter (Signed)
Call returned to patient and informed of doctor response. She confirmed dates and time of her July appointments

## 2022-09-03 NOTE — Telephone Encounter (Signed)
Patient called asking for return call to go over her lab results yesterday  CBC with Differential/Platelet Order: [id number] Status: Final result     Visible to patient: No (inaccessible in MyChart)     Next appt: 12/08/2022 at 10:15 AM in Oncology ([facility])     Dx: Iron deficiency anemia, unspecified i...   0 Result Notes           Component Ref Range & Units 1 d ago (09/02/22) 3 wk ago (08/12/22) 1 mo ago (07/19/22) 4 mo ago (04/21/22) 3 yr ago (05/16/19) 11 yr ago (12/11/10) 11 yr ago (10/15/10) 11 yr ago (10/15/10)  WBC 4.0 - 10.5 K/uL 5.2 4.6 6.0 6.2 7.4 9.0  5.8  RBC 3.87 - 5.11 MIL/uL 4.47 4.28 4.25 4.40 4.57 4.87  4.72  Hemoglobin 12.0 - 15.0 g/dL 9.5 Low  7.4 Low  CM 7.6 Low  CM 8.7 Low  CM 10.2 Low  14.3  13.6  Comment: Reticulocyte Hemoglobin testing may be clinically indicated, consider ordering this additional test [id number]  HCT 36.0 - 46.0 % 33.0 Low  26.7 Low  27.4 Low  30.4 Low  35.0 Low  42.7  41.6  MCV 80.0 - 100.0 fL 73.8 Low  62.4 Low  64.5 Low  69.1 Low  76.6 Low  87.7 R  88.1 R  MCH 26.0 - 34.0 pg 21.3 Low  17.3 Low  17.9 Low  19.8 Low  22.3 Low  29.4  28.8  MCHC 30.0 - 36.0 g/dL 47.8 Low  29.5 Low  62.1 Low  28.6 Low  29.1 Low  33.5  32.7  RDW 11.5 - 15.5 % Not Measured 20.0 High  20.2 High  17.6 High  18.5 High  13.2  13.0  Platelets 150 - 400 K/uL 496 High  338 CM 336 371 480 High  288  255  nRBC 0.0 - 0.2 % 0.0 0.0 CM 0.0 0.0 0.0     Neutrophils Relative % % 52  65 47 42  53 R   Neutro Abs 1.7 - 7.7 K/uL 2.7  3.9 2.9 3.2  3.1   Lymphocytes Relative % 34  24 40 40  35 R   Lymphs Abs 0.7 - 4.0 K/uL 1.8  1.4 2.5 3.0  2.1   Monocytes Relative % R   Monocytes Absolute 0.1 - 1.0 K/uL 0.6  0.6 0.7 1.1 High   0.6   Eosinophils Relative % R   Eosinophils Absolute 0.0 - 0.5 K/uL 0.1  0.0 0.1 0.2  0.1 R   Basophils Relative % 0  0 R   Basophils Absolute 0.0 - 0.1 K/uL 0.0  0.0 0.0 0.0  0.0   WBC  Morphology MORPHOLOGY UNREMARKABLE         RBC Morphology HYPOCHROMIC RBC         Smear Review PLATELETS APPEAR ADEQUATE         Comment: LARGE PLTS NOTED ON SMEAR  Immature Granulocytes % 0  0 0 1     Abs Immature Granulocytes 0.00 - 0.07 K/uL 0.02  0.01 CM 0.02 CM 0.05 CM     Comment: Performed at [facility], [address] [place], [place] [address]  Resulting Agency [facility]  Specimen Collected: 09/02/22 12:59 Last Resulted: 09/02/22 13:45

## 2022-12-07 ENCOUNTER — Other Ambulatory Visit: Payer: Self-pay | Admitting: *Deleted

## 2022-12-07 DIAGNOSIS — D509 Iron deficiency anemia, unspecified: Secondary | ICD-10-CM

## 2022-12-08 ENCOUNTER — Inpatient Hospital Stay: Payer: Self-pay | Attending: Oncology

## 2022-12-08 DIAGNOSIS — D509 Iron deficiency anemia, unspecified: Secondary | ICD-10-CM | POA: Insufficient documentation

## 2022-12-08 LAB — CBC WITH DIFFERENTIAL/PLATELET
Abs Immature Granulocytes: 0.01 10*3/uL (ref 0.00–0.07)
Basophils Absolute: 0 10*3/uL (ref 0.0–0.1)
Basophils Relative: 1 %
Eosinophils Absolute: 0.1 10*3/uL (ref 0.0–0.5)
Eosinophils Relative: 1 %
HCT: 31 % — ABNORMAL LOW (ref 36.0–46.0)
Hemoglobin: 9.6 g/dL — ABNORMAL LOW (ref 12.0–15.0)
Immature Granulocytes: 0 %
Lymphocytes Relative: 37 %
Lymphs Abs: 1.5 10*3/uL (ref 0.7–4.0)
MCH: 24.2 pg — ABNORMAL LOW (ref 26.0–34.0)
MCHC: 31 g/dL (ref 30.0–36.0)
MCV: 78.3 fL — ABNORMAL LOW (ref 80.0–100.0)
Monocytes Absolute: 0.5 10*3/uL (ref 0.1–1.0)
Monocytes Relative: 12 %
Neutro Abs: 2 10*3/uL (ref 1.7–7.7)
Neutrophils Relative %: 49 %
Platelets: 346 10*3/uL (ref 150–400)
RBC: 3.96 MIL/uL (ref 3.87–5.11)
RDW: 14.5 % (ref 11.5–15.5)
WBC: 4 10*3/uL (ref 4.0–10.5)
nRBC: 0 % (ref 0.0–0.2)

## 2022-12-08 LAB — IRON AND TIBC
Iron: 25 ug/dL — ABNORMAL LOW (ref 28–170)
Saturation Ratios: 6 % — ABNORMAL LOW (ref 10.4–31.8)
TIBC: 455 ug/dL — ABNORMAL HIGH (ref 250–450)
UIBC: 430 ug/dL

## 2022-12-08 LAB — FERRITIN: Ferritin: 4 ng/mL — ABNORMAL LOW (ref 11–307)

## 2022-12-09 ENCOUNTER — Encounter: Payer: Self-pay | Admitting: Oncology

## 2022-12-09 ENCOUNTER — Inpatient Hospital Stay: Payer: Self-pay

## 2022-12-09 ENCOUNTER — Inpatient Hospital Stay (HOSPITAL_BASED_OUTPATIENT_CLINIC_OR_DEPARTMENT_OTHER): Payer: Self-pay | Admitting: Oncology

## 2022-12-09 VITALS — BP 150/102 | HR 82 | Temp 97.1°F | Resp 16 | Ht 62.0 in | Wt 178.0 lb

## 2022-12-09 VITALS — BP 149/91 | HR 79

## 2022-12-09 DIAGNOSIS — D509 Iron deficiency anemia, unspecified: Secondary | ICD-10-CM

## 2022-12-09 MED ORDER — SODIUM CHLORIDE 0.9 % IV SOLN
INTRAVENOUS | Status: DC
  Filled 2022-12-09: qty 250

## 2022-12-09 MED ORDER — SODIUM CHLORIDE 0.9 % IV SOLN
200.0000 mg | Freq: Once | INTRAVENOUS | Status: AC
  Administered 2022-12-09: 200 mg via INTRAVENOUS
  Filled 2022-12-09: qty 200

## 2022-12-09 NOTE — Progress Notes (Signed)
[facility]  [phone] [phone]) [phone]  ID: Chelsea West OB: [birth year]  MR#: [id number]  QVZ#:[id number]  Patient Care Team: Enid Baas, MD as PCP - General (Internal Medicine) Jeralyn Ruths, MD as Consulting Physician (Oncology)  CHIEF COMPLAINT: Iron deficiency anemia.  INTERVAL HISTORY: Patient returns to clinic today for repeat laboratory work, further evaluation, and consideration of IV Venofer.  She continues to have weakness and fatigue, but states this is mildly improved since receiving treatment several months ago.  She otherwise feels well. She has no neurologic complaints.  She denies any recent fevers or illnesses.  She has a good appetite and denies she has no chest pain, shortness of breath, cough, or hemoptysis.  She denies any nausea, vomiting, constipation, or diarrhea.  She has no known or hematochezia.  She has no urinary complaints.  Patient offers no further specific complaints today.  REVIEW OF SYSTEMS:   Review of Systems  Constitutional:  Positive for malaise/fatigue. Negative for fever and weight loss.  Respiratory: Negative.  Negative for cough, hemoptysis and shortness of breath.   Cardiovascular: Negative.  Negative for chest pain and leg swelling.  Gastrointestinal:  Negative for abdominal pain, blood in stool and melena.  Genitourinary: Negative.  Negative for hematuria.  Musculoskeletal: Negative.  Negative for back pain and myalgias.  Skin: Negative.  Negative for rash.  Neurological:  Positive for weakness. Negative for dizziness, focal weakness and headaches.  Psychiatric/Behavioral: Negative.  The patient is not nervous/anxious.     As per HPI. Otherwise, a complete review of systems is negative.  PAST MEDICAL HISTORY: Past Medical History:  Diagnosis Date   Anemia    Bronchitis, chronic (HCC)    Diabetes mellitus without complication (HCC)    Hypertension    Tubal pregnancy     PAST  SURGICAL HISTORY: Past Surgical History:  Procedure Laterality Date   LAPAROSCOPIC GASTRIC SLEEVE RESECTION     SALPINGOSTOMY      FAMILY HISTORY: History reviewed. No pertinent family history.  ADVANCED DIRECTIVES (Y/N):  N  HEALTH MAINTENANCE: Social History   Tobacco Use   Smoking status: Never   Smokeless tobacco: Never  Vaping Use   Vaping status: Never Used  Substance Use Topics   Alcohol use: No   Drug use: No     Colonoscopy:  PAP:  Bone density:  Lipid panel:  Allergies  Allergen Reactions   Penicillins Shortness Of Breath    Current Outpatient Medications  Medication Sig Dispense Refill   ondansetron (ZOFRAN) 4 MG tablet Take 1 tablet (4 mg total) by mouth every 6 (six) hours. 12 tablet 0   Vitamin D, Ergocalciferol, (DRISDOL) 1.25 MG (50000 UNIT) CAPS capsule Take 50,000 Units by mouth every 7 (seven) days.     amLODipine (NORVASC) 10 MG tablet Take 10 mg by mouth daily. (Patient not taking: Reported on 04/21/2022)     ferrous sulfate 325 (65 FE) MG EC tablet Take 325 mg by mouth 3 (three) times daily with meals. (Patient not taking: Reported on 12/09/2022)     metFORMIN (GLUCOPHAGE) 500 MG tablet Take 500 mg by mouth 2 (two) times daily with a meal. (Patient not taking: Reported on 04/21/2022)     omeprazole (PRILOSEC) 20 MG capsule Take 20 mg by mouth daily. (Patient not taking: Reported on 04/21/2022)     No current facility-administered medications for this visit.   Facility-Administered Medications Ordered in Other Visits  Medication Dose Route Frequency Provider Last Rate Last Admin  0.9 %  sodium chloride infusion   Intravenous Continuous Jeralyn Ruths, MD   Stopped at 12/09/22 1539    OBJECTIVE: Vitals:   12/09/22 1430  BP: (!) 150/102  Pulse: 82  Resp: 16  Temp: (!) 97.1 F (36.2 C)  SpO2: 100%     Body mass index is 32.56 kg/m.    ECOG FS:0 - Asymptomatic  General: Well-developed, well-nourished, no acute distress. Eyes: Pink  conjunctiva, anicteric sclera. HEENT: Normocephalic, moist mucous membranes. Lungs: No audible wheezing or coughing. Heart: Regular rate and rhythm. Abdomen: Soft, nontender, no obvious distention. Musculoskeletal: No edema, cyanosis, or clubbing. Neuro: Alert, answering all questions appropriately. Cranial nerves grossly intact. Skin: No rashes or petechiae noted. Psych: Normal affect.  LAB RESULTS:  Lab Results  Component Value Date   NA 138 07/19/2022   K 3.6 07/19/2022   CL 105 07/19/2022   CO2 24 07/19/2022   GLUCOSE 79 07/19/2022   BUN 14 07/19/2022   CREATININE 0.72 07/19/2022   CALCIUM 8.9 07/19/2022   PROT 7.9 07/19/2022   ALBUMIN 4.0 07/19/2022   AST 16 07/19/2022   ALT 8 07/19/2022   ALKPHOS 55 07/19/2022   BILITOT 1.7 (H) 07/19/2022   GFRNONAA >60 07/19/2022   GFRAA 53 (L) 05/16/2019    Lab Results  Component Value Date   WBC 4.0 12/08/2022   NEUTROABS 2.0 12/08/2022   HGB 9.6 (L) 12/08/2022   HCT 31.0 (L) 12/08/2022   MCV 78.3 (L) 12/08/2022   PLT 346 12/08/2022   Lab Results  Component Value Date   IRON 25 (L) 12/08/2022   TIBC 455 (H) 12/08/2022   IRONPCTSAT 6 (L) 12/08/2022    Lab Results  Component Value Date   FERRITIN 4 (L) 12/08/2022     STUDIES: No results found.  ASSESSMENT: Iron deficiency anemia.  PLAN:    Iron deficiency anemia: Patient has a history of gastric sleeve that is likely contributing.  Patient's hemoglobin remains decreased, but is improved to 9.6.  Iron stores remain decreased.  Previously, all of her other lab Gwyndolyn Kaufman work was either negative or within normal limits including hemoglobinopathy profile.  Proceed with IV Venofer today.  Return to clinic for additional times over the next several weeks to receive treatment.  Patient will then return to clinic in 4 months with repeat laboratory work, further evaluation, and continuation of treatment if needed.   I spent a total of 30 minutes reviewing chart data,  face-to-face evaluation with the patient, counseling and coordination of care as detailed above.  Patient expressed understanding and was in agreement with this plan. She also understands that She can call clinic at any time with any questions, concerns, or complaints.     Jeralyn Ruths, MD   12/09/2022 4:29 PM

## 2022-12-09 NOTE — Progress Notes (Signed)
Patient declined to wait the 30 minutes for post iron infusion observation today.  Post iron education done. Patient verbalized understanding. 

## 2022-12-10 MED FILL — Iron Sucrose Inj 20 MG/ML (Fe Equiv): INTRAVENOUS | Qty: 10 | Status: AC

## 2022-12-13 ENCOUNTER — Inpatient Hospital Stay: Payer: Self-pay

## 2022-12-13 VITALS — BP 119/78 | HR 68 | Resp 16

## 2022-12-13 DIAGNOSIS — D509 Iron deficiency anemia, unspecified: Secondary | ICD-10-CM

## 2022-12-13 MED ORDER — SODIUM CHLORIDE 0.9 % IV SOLN
INTRAVENOUS | Status: DC | PRN
  Filled 2022-12-13: qty 250

## 2022-12-13 MED ORDER — SODIUM CHLORIDE 0.9 % IV SOLN
200.0000 mg | Freq: Once | INTRAVENOUS | Status: AC
  Administered 2022-12-13: 200 mg via INTRAVENOUS
  Filled 2022-12-13: qty 200

## 2022-12-16 ENCOUNTER — Inpatient Hospital Stay: Payer: Self-pay

## 2022-12-16 VITALS — BP 138/89 | HR 54 | Temp 97.4°F

## 2022-12-16 DIAGNOSIS — D509 Iron deficiency anemia, unspecified: Secondary | ICD-10-CM

## 2022-12-16 MED ORDER — SODIUM CHLORIDE 0.9 % IV SOLN
200.0000 mg | Freq: Once | INTRAVENOUS | Status: AC
  Administered 2022-12-16: 200 mg via INTRAVENOUS
  Filled 2022-12-16: qty 200

## 2022-12-16 MED ORDER — SODIUM CHLORIDE 0.9 % IV SOLN
Freq: Once | INTRAVENOUS | Status: AC
  Filled 2022-12-16: qty 250

## 2022-12-16 NOTE — Patient Instructions (Signed)
Iron Sucrose Injection What is this medication? IRON SUCROSE (EYE ern SOO krose) treats low levels of iron (iron deficiency anemia) in people with kidney disease. Iron is a mineral that plays an important role in making red blood cells, which carry oxygen from your lungs to the rest of your body. This medicine may be used for other purposes; ask your health care provider or pharmacist if you have questions. COMMON BRAND NAME(S): Venofer What should I tell my care team before I take this medication? They need to know if you have any of these conditions: Anemia not caused by low iron levels Heart disease High levels of iron in the blood Kidney disease Liver disease An unusual or allergic reaction to iron, other medications, foods, dyes, or preservatives Pregnant or trying to get pregnant Breastfeeding How should I use this medication? This medication is for infusion into a vein. It is given in a hospital or clinic setting. Talk to your care team about the use of this medication in children. While this medication may be prescribed for children as young as 2 years for selected conditions, precautions do apply. Overdosage: If you think you have taken too much of this medicine contact a poison control center or emergency room at once. NOTE: This medicine is only for you. Do not share this medicine with others. What if I miss a dose? Keep appointments for follow-up doses. It is important not to miss your dose. Call your care team if you are unable to keep an appointment. What may interact with this medication? Do not take this medication with any of the following: Deferoxamine Dimercaprol Other iron products This medication may also interact with the following: Chloramphenicol Deferasirox This list may not describe all possible interactions. Give your health care provider a list of all the medicines, herbs, non-prescription drugs, or dietary supplements you use. Also tell them if you smoke,  drink alcohol, or use illegal drugs. Some items may interact with your medicine. What should I watch for while using this medication? Visit your care team regularly. Tell your care team if your symptoms do not start to get better or if they get worse. You may need blood work done while you are taking this medication. You may need to follow a special diet. Talk to your care team. Foods that contain iron include: whole grains/cereals, dried fruits, beans, or peas, leafy green vegetables, and organ meats (liver, kidney). What side effects may I notice from receiving this medication? Side effects that you should report to your care team as soon as possible: Allergic reactions--skin rash, itching, hives, swelling of the face, lips, tongue, or throat Low blood pressure--dizziness, feeling faint or lightheaded, blurry vision Shortness of breath Side effects that usually do not require medical attention (report to your care team if they continue or are bothersome): Flushing Headache Joint pain Muscle pain Nausea Pain, redness, or irritation at injection site This list may not describe all possible side effects. Call your doctor for medical advice about side effects. You may report side effects to FDA at 1-800-FDA-1088. Where should I keep my medication? This medication is given in a hospital or clinic. It will not be stored at home. NOTE: This sheet is a summary. It may not cover all possible information. If you have questions about this medicine, talk to your doctor, pharmacist, or health care provider.  2024 Elsevier/Gold Standard (2022-10-15 00:00:00)

## 2022-12-21 ENCOUNTER — Inpatient Hospital Stay: Payer: Self-pay

## 2022-12-21 VITALS — BP 136/85 | HR 65 | Temp 99.0°F | Resp 16

## 2022-12-21 DIAGNOSIS — D509 Iron deficiency anemia, unspecified: Secondary | ICD-10-CM

## 2022-12-21 MED ORDER — SODIUM CHLORIDE 0.9 % IV SOLN
200.0000 mg | Freq: Once | INTRAVENOUS | Status: AC
  Administered 2022-12-21: 200 mg via INTRAVENOUS
  Filled 2022-12-21: qty 200

## 2022-12-21 MED ORDER — SODIUM CHLORIDE 0.9 % IV SOLN
INTRAVENOUS | Status: DC
  Filled 2022-12-21: qty 250

## 2022-12-21 NOTE — Patient Instructions (Signed)
Iron Sucrose Injection What is this medication? IRON SUCROSE (EYE ern SOO krose) treats low levels of iron (iron deficiency anemia) in people with kidney disease. Iron is a mineral that plays an important role in making red blood cells, which carry oxygen from your lungs to the rest of your body. This medicine may be used for other purposes; ask your health care provider or pharmacist if you have questions. COMMON BRAND NAME(S): Venofer What should I tell my care team before I take this medication? They need to know if you have any of these conditions: Anemia not caused by low iron levels Heart disease High levels of iron in the blood Kidney disease Liver disease An unusual or allergic reaction to iron, other medications, foods, dyes, or preservatives Pregnant or trying to get pregnant Breastfeeding How should I use this medication? This medication is for infusion into a vein. It is given in a hospital or clinic setting. Talk to your care team about the use of this medication in children. While this medication may be prescribed for children as young as 2 years for selected conditions, precautions do apply. Overdosage: If you think you have taken too much of this medicine contact a poison control center or emergency room at once. NOTE: This medicine is only for you. Do not share this medicine with others. What if I miss a dose? Keep appointments for follow-up doses. It is important not to miss your dose. Call your care team if you are unable to keep an appointment. What may interact with this medication? Do not take this medication with any of the following: Deferoxamine Dimercaprol Other iron products This medication may also interact with the following: Chloramphenicol Deferasirox This list may not describe all possible interactions. Give your health care provider a list of all the medicines, herbs, non-prescription drugs, or dietary supplements you use. Also tell them if you smoke,  drink alcohol, or use illegal drugs. Some items may interact with your medicine. What should I watch for while using this medication? Visit your care team regularly. Tell your care team if your symptoms do not start to get better or if they get worse. You may need blood work done while you are taking this medication. You may need to follow a special diet. Talk to your care team. Foods that contain iron include: whole grains/cereals, dried fruits, beans, or peas, leafy green vegetables, and organ meats (liver, kidney). What side effects may I notice from receiving this medication? Side effects that you should report to your care team as soon as possible: Allergic reactions--skin rash, itching, hives, swelling of the face, lips, tongue, or throat Low blood pressure--dizziness, feeling faint or lightheaded, blurry vision Shortness of breath Side effects that usually do not require medical attention (report to your care team if they continue or are bothersome): Flushing Headache Joint pain Muscle pain Nausea Pain, redness, or irritation at injection site This list may not describe all possible side effects. Call your doctor for medical advice about side effects. You may report side effects to FDA at 1-800-FDA-1088. Where should I keep my medication? This medication is given in a hospital or clinic. It will not be stored at home. NOTE: This sheet is a summary. It may not cover all possible information. If you have questions about this medicine, talk to your doctor, pharmacist, or health care provider.  2024 Elsevier/Gold Standard (2022-10-15 00:00:00)

## 2022-12-21 NOTE — Progress Notes (Signed)
Patient declined staying for 30 minute observation after Venofer. Patient educated that there can be a delayed allergic reaction. Patient aware to go to ED if any sign of allergic reaction occurs.

## 2022-12-22 MED FILL — Iron Sucrose Inj 20 MG/ML (Fe Equiv): INTRAVENOUS | Qty: 10 | Status: AC

## 2022-12-23 ENCOUNTER — Inpatient Hospital Stay: Payer: Self-pay | Attending: Oncology

## 2022-12-23 VITALS — BP 133/78 | HR 75 | Temp 97.8°F | Resp 16

## 2022-12-23 DIAGNOSIS — D509 Iron deficiency anemia, unspecified: Secondary | ICD-10-CM | POA: Insufficient documentation

## 2022-12-23 DIAGNOSIS — Z79899 Other long term (current) drug therapy: Secondary | ICD-10-CM | POA: Insufficient documentation

## 2022-12-23 MED ORDER — SODIUM CHLORIDE 0.9 % IV SOLN
200.0000 mg | Freq: Once | INTRAVENOUS | Status: AC
  Administered 2022-12-23: 200 mg via INTRAVENOUS
  Filled 2022-12-23: qty 200

## 2022-12-23 MED ORDER — SODIUM CHLORIDE 0.9 % IV SOLN
INTRAVENOUS | Status: DC
  Filled 2022-12-23: qty 250

## 2022-12-23 NOTE — Progress Notes (Signed)
Patient denied staying for 30 minutes observation period. Informed her to go to ED if signs of allergic reaction occur. Patient verbalized understanding.

## 2023-01-07 ENCOUNTER — Other Ambulatory Visit: Payer: Self-pay

## 2023-01-07 ENCOUNTER — Emergency Department (HOSPITAL_COMMUNITY)
Admission: EM | Admit: 2023-01-07 | Discharge: 2023-01-07 | Disposition: A | Discharge status: Home / Self Care | Payer: Self-pay | Attending: Emergency Medicine | Admitting: Emergency Medicine

## 2023-01-07 ENCOUNTER — Emergency Department (HOSPITAL_COMMUNITY): Payer: Self-pay

## 2023-01-07 DIAGNOSIS — I10 Essential (primary) hypertension: Secondary | ICD-10-CM | POA: Insufficient documentation

## 2023-01-07 DIAGNOSIS — E119 Type 2 diabetes mellitus without complications: Secondary | ICD-10-CM | POA: Insufficient documentation

## 2023-01-07 DIAGNOSIS — Z79899 Other long term (current) drug therapy: Secondary | ICD-10-CM | POA: Insufficient documentation

## 2023-01-07 DIAGNOSIS — Z7984 Long term (current) use of oral hypoglycemic drugs: Secondary | ICD-10-CM | POA: Insufficient documentation

## 2023-01-07 DIAGNOSIS — R0789 Other chest pain: Secondary | ICD-10-CM | POA: Insufficient documentation

## 2023-01-07 LAB — BASIC METABOLIC PANEL
Anion gap: 15 (ref 5–15)
BUN: 12 mg/dL (ref 6–20)
CO2: 24 mmol/L (ref 22–32)
Calcium: 8.8 mg/dL — ABNORMAL LOW (ref 8.9–10.3)
Chloride: 97 mmol/L — ABNORMAL LOW (ref 98–111)
Creatinine, Ser: 0.69 mg/dL (ref 0.44–1.00)
GFR, Estimated: >60 mL/min (ref >60)
Glucose, Bld: 82 mg/dL (ref 70–99)
Potassium: 3.5 mmol/L (ref 3.5–5.1)
Sodium: 136 mmol/L (ref 135–145)

## 2023-01-07 LAB — CBC
HCT: 41.6 % (ref 36.0–46.0)
Hemoglobin: 12.3 g/dL (ref 12.0–15.0)
MCH: 26.9 pg (ref 26.0–34.0)
MCHC: 29.6 g/dL — ABNORMAL LOW (ref 30.0–36.0)
MCV: 91 fL (ref 80.0–100.0)
Platelets: 286 10*3/uL (ref 150–400)
RBC: 4.57 MIL/uL (ref 3.87–5.11)
RDW: 21.6 % — ABNORMAL HIGH (ref 11.5–15.5)
WBC: 5.6 10*3/uL (ref 4.0–10.5)
nRBC: 0 % (ref 0.0–0.2)

## 2023-01-07 LAB — TROPONIN I (HIGH SENSITIVITY)
Troponin I (High Sensitivity): <2 ng/L (ref <18)
Troponin I (High Sensitivity): 2 ng/L (ref <18)

## 2023-01-07 MED ORDER — OXYCODONE-ACETAMINOPHEN 5-325 MG PO TABS
1.0000 | ORAL_TABLET | Freq: Once | ORAL | Status: AC
  Administered 2023-01-07: 1 via ORAL
  Filled 2023-01-07: qty 1

## 2023-01-07 MED ORDER — NAPROXEN 500 MG PO TABS: 500.0000 mg | ORAL_TABLET | Freq: Two times a day (BID) | ORAL | 0 refills | Status: AC

## 2023-01-07 NOTE — Discharge Instructions (Signed)
Follow-up with your doctor next week for recheck 

## 2023-01-07 NOTE — ED Triage Notes (Signed)
Chest pain started 3 hours ago. Pt describes as a sharp stabbing pain to left side of chest comes and goes. Does not radiate. Some SOB.

## 2023-01-08 NOTE — ED Provider Notes (Signed)
[place] EMERGENCY DEPARTMENT AT [facility] Provider Note   CSN: [id number] Arrival date & time: 01/07/23  1825     History  Chief Complaint  Patient presents with   Chest Pain    Chelsea West is a 47 y.o. female.  Patient has a history of diabetes and hypertension.  Patient states she has been having left-sided chest pain today that has been lasting only for a few seconds and then goes away.  She has had like 3-4 episodes  The history is provided by the patient and medical records. No language interpreter was used.  Chest Pain Pain location:  L chest Pain quality: aching   Pain radiates to:  Does not radiate Pain severity:  Mild Onset quality:  Sudden Timing:  Intermittent Progression:  Unchanged Chronicity:  New Context: not breathing   Relieved by:  Nothing Worsened by:  Nothing Ineffective treatments:  None tried Associated symptoms: no abdominal pain, no back pain, no cough, no fatigue and no headache        Home Medications Prior to Admission medications   Medication Sig Start Date End Date Taking? Authorizing Provider  naproxen (NAPROSYN) 500 MG tablet Take 1 tablet (500 mg total) by mouth 2 (two) times daily. 01/07/23  Yes Bethann Berkshire, MD  amLODipine (NORVASC) 10 MG tablet Take 10 mg by mouth daily. Patient not taking: Reported on 04/21/2022    [provider]  ferrous sulfate 325 (65 FE) MG EC tablet Take 325 mg by mouth 3 (three) times daily with meals. Patient not taking: Reported on 12/09/2022    [provider]  metFORMIN (GLUCOPHAGE) 500 MG tablet Take 500 mg by mouth 2 (two) times daily with a meal. Patient not taking: Reported on 04/21/2022    [provider]  omeprazole (PRILOSEC) 20 MG capsule Take 20 mg by mouth daily. Patient not taking: Reported on 04/21/2022    [provider]  ondansetron (ZOFRAN) 4 MG tablet Take 1 tablet (4 mg total) by mouth every 6 (six) hours. 04/21/22   Gareth Eagle, PA-C  Vitamin D, Ergocalciferol, (DRISDOL) 1.25 MG (50000 UNIT) CAPS capsule Take 50,000 Units by mouth every 7 (seven) days.    [provider]      Allergies    Penicillins    Review of Systems   Review of Systems  Constitutional:  Negative for appetite change and fatigue.  HENT:  Negative for congestion, ear discharge and sinus pressure.   Eyes:  Negative for discharge.  Respiratory:  Negative for cough.   Cardiovascular:  Positive for chest pain.  Gastrointestinal:  Negative for abdominal pain and diarrhea.  Genitourinary:  Negative for frequency and hematuria.  Musculoskeletal:  Negative for back pain.  Skin:  Negative for rash.  Neurological:  Negative for seizures and headaches.  Psychiatric/Behavioral:  Negative for hallucinations.     Physical Exam Updated Vital Signs BP (!) 150/88   Pulse 63   Temp 99.1 F (37.3 C) (Oral)   Resp 14   Ht 5\' 2"  (1.575 m)   Wt 78.5 kg   LMP 12/23/2022 (Exact Date)   SpO2 99%   BMI 31.64 kg/m  Physical Exam Vitals and nursing note reviewed.  Constitutional:      Appearance: She is well-developed.  HENT:     Head: Normocephalic.     Nose: Nose normal.  Eyes:     General: No scleral icterus.    Conjunctiva/sclera: Conjunctivae normal.  Neck:  Thyroid: No thyromegaly.  Cardiovascular:     Rate and Rhythm: Normal rate and regular rhythm.     Heart sounds: No murmur heard.    No friction rub. No gallop.  Pulmonary:     Breath sounds: No stridor. No wheezing or rales.  Chest:     Chest wall: No tenderness.  Abdominal:     General: There is no distension.     Tenderness: There is no abdominal tenderness. There is no rebound.  Musculoskeletal:        General: Normal range of motion.     Cervical back: Neck supple.  Lymphadenopathy:     Cervical: No cervical adenopathy.  Skin:    Findings: No erythema or rash.  Neurological:     Mental Status: She is alert and oriented to person, place, and time.      Motor: No abnormal muscle tone.     Coordination: Coordination normal.  Psychiatric:        Behavior: Behavior normal.     ED Results / Procedures / Treatments   Labs (all labs ordered are listed, but only abnormal results are displayed) Labs Reviewed  BASIC METABOLIC PANEL - Abnormal; Notable for the following components:      Result Value   Chloride 97 (*)    Calcium 8.8 (*)    All other components within normal limits  CBC - Abnormal; Notable for the following components:   MCHC 29.6 (*)    RDW 21.6 (*)    All other components within normal limits  TROPONIN I (HIGH SENSITIVITY)  TROPONIN I (HIGH SENSITIVITY)    EKG EKG Interpretation Date/Time:  Friday January 07 2023 18:48:03 EDT Ventricular Rate:  68 PR Interval:  178 QRS Duration:  78 QT Interval:  408 QTC Calculation: 433 R Axis:   244  Text Interpretation: Normal sinus rhythm Right superior axis deviation Pulmonary disease pattern Abnormal ECG When compared with ECG of 19-Jul-2022 14:37, Questionable change in QRS axis Confirmed by Bethann Berkshire [phone]) on 01/07/2023 7:12:19 PM  Radiology DG Chest 2 View  Result Date: 01/07/2023 CLINICAL DATA:  Chest pain EXAM: CHEST - 2 VIEW COMPARISON:  09/10/2011 FINDINGS: The heart size and mediastinal contours are within normal limits. Both lungs are clear. The visualized skeletal structures are unremarkable. IMPRESSION: No acute abnormality of the lungs. Electronically Signed   By: Jearld Lesch M.D.   On: 01/07/2023 19:18    Procedures Procedures    Medications Ordered in ED Medications  oxyCODONE-acetaminophen (PERCOCET/ROXICET) 5-325 MG per tablet 1 tablet (1 tablet Oral Given 01/07/23 1941)    ED Course/ Medical Decision Making/ A&P                                 Medical Decision Making Amount and/or Complexity of Data Reviewed Labs: ordered. Radiology: ordered.  Risk Prescription drug management.  This patient presents to the ED for concern of chest  pain, this involves an extensive number of treatment options, and is a complaint that carries with it a high risk of complications and morbidity.  The differential diagnosis includes MI, PE, chest wall pain   Co morbidities that complicate the patient evaluation  Diabetes and hypertension   Additional history obtained:  Additional history obtained from patient External records from outside source obtained and reviewed including medical records   Lab Tests:  I Ordered, and personally interpreted labs.  The pertinent results include: CBC normal  and troponin normal   Imaging Studies ordered:  I ordered imaging studies including chest x-ray I independently visualized and interpreted imaging which showed negative I agree with the radiologist interpretation   Cardiac Monitoring: / EKG:  The patient was maintained on a cardiac monitor.  I personally viewed and interpreted the cardiac monitored which showed an underlying rhythm of: Normal sinus rhythm   Consultations Obtained:  No consultant  Problem List / ED Course / Critical interventions / Medication management  Chest pain, diabetes hypertension I ordered medication including oxycodone for pain Reevaluation of the patient after these medicines showed that the patient stayed the same I have reviewed the patients home medicines and have made adjustments as needed   Social Determinants of Health:  None   Test / Admission - Considered:  None  Patient with chest wall pain.  She is put on Naprosyn and will follow-up with PCP        Final Clinical Impression(s) / ED Diagnoses Final diagnoses:  Chest wall pain    Rx / DC Orders ED Discharge Orders          Ordered    naproxen (NAPROSYN) 500 MG tablet  2 times daily        01/07/23 2317              Bethann Berkshire, MD 01/08/23 1156

## 2023-04-07 ENCOUNTER — Other Ambulatory Visit: Payer: Self-pay | Admitting: *Deleted

## 2023-04-07 DIAGNOSIS — D509 Iron deficiency anemia, unspecified: Secondary | ICD-10-CM

## 2023-04-08 ENCOUNTER — Inpatient Hospital Stay: Payer: Self-pay

## 2023-04-11 ENCOUNTER — Inpatient Hospital Stay: Payer: Self-pay | Admitting: Oncology

## 2023-04-11 ENCOUNTER — Inpatient Hospital Stay: Payer: Self-pay

## 2023-04-13 ENCOUNTER — Telehealth: Payer: Self-pay | Admitting: *Deleted

## 2023-04-13 NOTE — Telephone Encounter (Signed)
Patient called asking if her appointments can be moved up from this Friday and then Monday to tomorrow due to dizziness weakness and fatigue and feeling that she cannot wait any longer. Please advise

## 2023-04-14 ENCOUNTER — Inpatient Hospital Stay: Payer: Self-pay | Attending: Oncology

## 2023-04-14 DIAGNOSIS — D509 Iron deficiency anemia, unspecified: Secondary | ICD-10-CM

## 2023-04-14 DIAGNOSIS — Z9884 Bariatric surgery status: Secondary | ICD-10-CM | POA: Insufficient documentation

## 2023-04-14 DIAGNOSIS — Z79899 Other long term (current) drug therapy: Secondary | ICD-10-CM | POA: Insufficient documentation

## 2023-04-14 LAB — CBC WITH DIFFERENTIAL/PLATELET
Abs Immature Granulocytes: 0.02 10*3/uL (ref 0.00–0.07)
Basophils Absolute: 0 10*3/uL (ref 0.0–0.1)
Basophils Relative: 1 %
Eosinophils Absolute: 0 10*3/uL (ref 0.0–0.5)
Eosinophils Relative: 1 %
HCT: 36.3 % (ref 36.0–46.0)
Hemoglobin: 10.4 g/dL — ABNORMAL LOW (ref 12.0–15.0)
Immature Granulocytes: 1 %
Lymphocytes Relative: 32 %
Lymphs Abs: 1.4 10*3/uL (ref 0.7–4.0)
MCH: 25.1 pg — ABNORMAL LOW (ref 26.0–34.0)
MCHC: 28.7 g/dL — ABNORMAL LOW (ref 30.0–36.0)
MCV: 87.5 fL (ref 80.0–100.0)
Monocytes Absolute: 0.6 10*3/uL (ref 0.1–1.0)
Monocytes Relative: 13 %
Neutro Abs: 2.3 10*3/uL (ref 1.7–7.7)
Neutrophils Relative %: 52 %
Platelets: 300 10*3/uL (ref 150–400)
RBC: 4.15 MIL/uL (ref 3.87–5.11)
RDW: 13.8 % (ref 11.5–15.5)
WBC: 4.4 10*3/uL (ref 4.0–10.5)
nRBC: 0 % (ref 0.0–0.2)

## 2023-04-14 LAB — IRON AND TIBC
Iron: 27 ug/dL — ABNORMAL LOW (ref 28–170)
Saturation Ratios: 5 % — ABNORMAL LOW (ref 10.4–31.8)
TIBC: 525 ug/dL — ABNORMAL HIGH (ref 250–450)
UIBC: 498 ug/dL

## 2023-04-14 LAB — FERRITIN: Ferritin: 7 ng/mL — ABNORMAL LOW (ref 11–307)

## 2023-04-15 ENCOUNTER — Inpatient Hospital Stay: Payer: Self-pay

## 2023-04-15 ENCOUNTER — Encounter: Payer: Self-pay | Admitting: Oncology

## 2023-04-15 ENCOUNTER — Inpatient Hospital Stay (HOSPITAL_BASED_OUTPATIENT_CLINIC_OR_DEPARTMENT_OTHER): Payer: Self-pay | Admitting: Oncology

## 2023-04-15 VITALS — BP 159/95 | HR 67 | Temp 96.7°F | Resp 18 | Ht 62.0 in | Wt 182.7 lb

## 2023-04-15 VITALS — BP 142/67 | HR 60

## 2023-04-15 DIAGNOSIS — D509 Iron deficiency anemia, unspecified: Secondary | ICD-10-CM

## 2023-04-15 MED ORDER — IRON SUCROSE 20 MG/ML IV SOLN
200.0000 mg | Freq: Once | INTRAVENOUS | Status: AC
  Administered 2023-04-15: 200 mg via INTRAVENOUS

## 2023-04-15 MED ORDER — SODIUM CHLORIDE 0.9% FLUSH
10.0000 mL | Freq: Once | INTRAVENOUS | Status: DC | PRN
  Filled 2023-04-15: qty 10

## 2023-04-15 NOTE — Progress Notes (Signed)
[facility]  [phone] [phone]  ID: Chelsea West OB: 11-13-1975  MR#: [id number]  NGE#:[id number]  Patient Care Team: Enid Baas, MD as PCP - General (Internal Medicine) Jeralyn Ruths, MD as Consulting Physician (Oncology)  CHIEF COMPLAINT: Iron deficiency anemia.  INTERVAL HISTORY: Patient returns to clinic today for repeat laboratory, further evaluation, and consideration of IV Venofer.  Patient reports over the last several weeks she has had increasing weakness and fatigue, several falls, and dizziness.  She is also persistently cold.  She has no other neurologic complaints.  She denies any recent fevers or illnesses.  She has a good appetite and denies weight loss.  She has no chest pain, shortness of breath, cough, or hemoptysis.  She denies any nausea, vomiting, constipation, or diarrhea.  She has no known or hematochezia.  She has no urinary complaints.  Patient offers no further specific complaints today.    REVIEW OF SYSTEMS:   Review of Systems  Constitutional:  Positive for malaise/fatigue. Negative for fever and weight loss.  Respiratory: Negative.  Negative for cough, hemoptysis and shortness of breath.   Cardiovascular: Negative.  Negative for chest pain and leg swelling.  Gastrointestinal:  Negative for abdominal pain, blood in stool and melena.  Genitourinary: Negative.  Negative for hematuria.  Musculoskeletal:  Positive for falls. Negative for back pain and myalgias.  Skin: Negative.  Negative for rash.  Neurological:  Positive for dizziness and weakness. Negative for focal weakness and headaches.  Psychiatric/Behavioral: Negative.  The patient is not nervous/anxious.     As per HPI. Otherwise, a complete review of systems is negative.  PAST MEDICAL HISTORY: Past Medical History:  Diagnosis Date   Anemia    Bronchitis, chronic (HCC)    Diabetes mellitus without complication (HCC)    Hypertension     Tubal pregnancy     PAST SURGICAL HISTORY: Past Surgical History:  Procedure Laterality Date   LAPAROSCOPIC GASTRIC SLEEVE RESECTION     SALPINGOSTOMY      FAMILY HISTORY: History reviewed. No pertinent family history.  ADVANCED DIRECTIVES (Y/N):  N  HEALTH MAINTENANCE: Social History   Tobacco Use   Smoking status: Never   Smokeless tobacco: Never  Vaping Use   Vaping status: Never Used  Substance Use Topics   Alcohol use: No   Drug use: No     Colonoscopy:  PAP:  Bone density:  Lipid panel:  Allergies  Allergen Reactions   Penicillins Shortness Of Breath    Current Outpatient Medications  Medication Sig Dispense Refill   Vitamin D, Ergocalciferol, (DRISDOL) 1.25 MG (50000 UNIT) CAPS capsule Take 50,000 Units by mouth every 7 (seven) days.     amLODipine (NORVASC) 10 MG tablet Take 10 mg by mouth daily. (Patient not taking: Reported on 04/21/2022)     ferrous sulfate 325 (65 FE) MG EC tablet Take 325 mg by mouth 3 (three) times daily with meals. (Patient not taking: Reported on 12/09/2022)     metFORMIN (GLUCOPHAGE) 500 MG tablet Take 500 mg by mouth 2 (two) times daily with a meal. (Patient not taking: Reported on 04/21/2022)     naproxen (NAPROSYN) 500 MG tablet Take 1 tablet (500 mg total) by mouth 2 (two) times daily. (Patient not taking: Reported on 04/15/2023) 20 tablet 0   omeprazole (PRILOSEC) 20 MG capsule Take 20 mg by mouth daily. (Patient not taking: Reported on 04/21/2022)     ondansetron (ZOFRAN) 4 MG tablet Take 1 tablet (4 mg  total) by mouth every 6 (six) hours. (Patient not taking: Reported on 04/15/2023) 12 tablet 0   No current facility-administered medications for this visit.   Facility-Administered Medications Ordered in Other Visits  Medication Dose Route Frequency Provider Last Rate Last Admin   sodium chloride flush (NS) 0.9 % injection 10 mL  10 mL Intracatheter Once PRN Jeralyn Ruths, MD        OBJECTIVE: Vitals:   04/15/23 1030   BP: (!) 159/95  Pulse: 67  Resp: 18  Temp: (!) 96.7 F (35.9 C)  SpO2: 100%     Body mass index is 33.42 kg/m.    ECOG FS:0 - Asymptomatic  General: Well-developed, well-nourished, no acute distress. Eyes: Pink conjunctiva, anicteric sclera. HEENT: Normocephalic, moist mucous membranes. Lungs: No audible wheezing or coughing. Heart: Regular rate and rhythm. Abdomen: Soft, nontender, no obvious distention. Musculoskeletal: No edema, cyanosis, or clubbing. Neuro: Alert, answering all questions appropriately. Cranial nerves grossly intact. Skin: No rashes or petechiae noted. Psych: Normal affect.  LAB RESULTS:  Lab Results  Component Value Date   NA 136 01/07/2023   K 3.5 01/07/2023   CL 97 (L) 01/07/2023   CO2 24 01/07/2023   GLUCOSE 82 01/07/2023   BUN 12 01/07/2023   CREATININE 0.69 01/07/2023   CALCIUM 8.8 (L) 01/07/2023   PROT 7.9 07/19/2022   ALBUMIN 4.0 07/19/2022   AST 16 07/19/2022   ALT 8 07/19/2022   ALKPHOS 55 07/19/2022   BILITOT 1.7 (H) 07/19/2022   GFRNONAA >60 01/07/2023   GFRAA 53 (L) 05/16/2019    Lab Results  Component Value Date   WBC 4.4 04/14/2023   NEUTROABS 2.3 04/14/2023   HGB 10.4 (L) 04/14/2023   HCT 36.3 04/14/2023   MCV 87.5 04/14/2023   PLT 300 04/14/2023   Lab Results  Component Value Date   IRON 27 (L) 04/14/2023   TIBC 525 (H) 04/14/2023   IRONPCTSAT 5 (L) 04/14/2023    Lab Results  Component Value Date   FERRITIN 7 (L) 04/14/2023     STUDIES: No results found.  ASSESSMENT: Iron deficiency anemia.  PLAN:    Iron deficiency anemia: Patient has a history of gastric sleeve that is likely contributing.  Patient's hemoglobin and iron stores are decreased and she is symptomatic.  Previously, all of her other laboratory work was either negative or within normal limits including hemoglobinopathy profile.  Proceed with 200 mg IV Venofer today.  Return to clinic 4x over the next 2 to 3 weeks for additional treatment.   Patient will then return to clinic in 4 months with repeat laboratory, further evaluation, and continuation of treatment if needed.   Dizziness: Unclear if this is related to iron deficiency.  Monitor.   Hypertension: Patient's blood pressure is moderately elevated today.  Continue monitoring and treatment per primary care.     Patient expressed understanding and was in agreement with this plan. She also understands that She can call clinic at any time with any questions, concerns, or complaints.     Jeralyn Ruths, MD   04/15/2023 1:07 PM

## 2023-04-15 NOTE — Progress Notes (Signed)
Starting last Monday has been more weak, tired, and feeling like heart has been racing more. States she feels like she has been stumbling a little bit. Also complains of night sweats.

## 2023-04-18 ENCOUNTER — Telehealth: Payer: Self-pay | Admitting: *Deleted

## 2023-04-18 ENCOUNTER — Inpatient Hospital Stay: Payer: Self-pay | Admitting: Oncology

## 2023-04-18 ENCOUNTER — Inpatient Hospital Stay: Payer: Self-pay

## 2023-04-18 NOTE — Telephone Encounter (Signed)
Call returned to patient and advised of doctor response and she is in agreement with the recommendations

## 2023-04-18 NOTE — Telephone Encounter (Addendum)
Patient called reporting that she has been feeing dizzy and has fallen and is asking if this is from the low iron , Does she need to do anything? Please advise

## 2023-04-28 ENCOUNTER — Inpatient Hospital Stay: Payer: Self-pay

## 2023-04-28 ENCOUNTER — Inpatient Hospital Stay: Payer: Self-pay | Attending: Oncology

## 2023-04-28 VITALS — BP 156/84 | HR 66 | Temp 96.8°F | Resp 18

## 2023-04-28 DIAGNOSIS — Z79899 Other long term (current) drug therapy: Secondary | ICD-10-CM | POA: Insufficient documentation

## 2023-04-28 DIAGNOSIS — D509 Iron deficiency anemia, unspecified: Secondary | ICD-10-CM | POA: Insufficient documentation

## 2023-04-28 MED ORDER — SODIUM CHLORIDE 0.9% FLUSH
10.0000 mL | Freq: Once | INTRAVENOUS | Status: AC | PRN
  Administered 2023-04-28: 10 mL
  Filled 2023-04-28: qty 10

## 2023-04-28 MED ORDER — IRON SUCROSE 20 MG/ML IV SOLN
200.0000 mg | Freq: Once | INTRAVENOUS | Status: AC
  Administered 2023-04-28: 200 mg via INTRAVENOUS
  Filled 2023-04-28: qty 10

## 2023-04-28 NOTE — Progress Notes (Signed)
Patient declined to wait the 30 minutes for post iron infusion observation today. Tolerated infusion well. VSS. 

## 2023-05-03 ENCOUNTER — Inpatient Hospital Stay: Payer: Self-pay

## 2023-05-05 ENCOUNTER — Inpatient Hospital Stay: Payer: Self-pay

## 2023-05-05 VITALS — BP 130/76 | HR 72 | Temp 97.9°F | Resp 18

## 2023-05-05 DIAGNOSIS — D509 Iron deficiency anemia, unspecified: Secondary | ICD-10-CM

## 2023-05-05 MED ORDER — IRON SUCROSE 20 MG/ML IV SOLN
200.0000 mg | Freq: Once | INTRAVENOUS | Status: AC
  Administered 2023-05-05: 200 mg via INTRAVENOUS
  Filled 2023-05-05: qty 10

## 2023-05-05 MED ORDER — SODIUM CHLORIDE 0.9% FLUSH
10.0000 mL | Freq: Once | INTRAVENOUS | Status: AC | PRN
  Administered 2023-05-05: 10 mL
  Filled 2023-05-05: qty 10

## 2023-05-05 NOTE — Progress Notes (Signed)
Patient declined to wait the 30 minutes for post iron infusion observation today. Tolerated infusion well. VSS. 

## 2023-05-10 ENCOUNTER — Inpatient Hospital Stay: Payer: Self-pay

## 2023-05-12 ENCOUNTER — Inpatient Hospital Stay: Payer: Self-pay

## 2023-05-12 VITALS — BP 146/98 | HR 80 | Temp 97.8°F | Resp 18

## 2023-05-12 DIAGNOSIS — D509 Iron deficiency anemia, unspecified: Secondary | ICD-10-CM

## 2023-05-12 MED ORDER — IRON SUCROSE 20 MG/ML IV SOLN
200.0000 mg | Freq: Once | INTRAVENOUS | Status: AC
Start: 2023-05-12 — End: 2023-05-12
  Administered 2023-05-12: 200 mg via INTRAVENOUS

## 2023-05-12 NOTE — Patient Instructions (Signed)
Iron Sucrose Injection What is this medication? IRON SUCROSE (EYE ern SOO krose) treats low levels of iron (iron deficiency anemia) in people with kidney disease. Iron is a mineral that plays an important role in making red blood cells, which carry oxygen from your lungs to the rest of your body. This medicine may be used for other purposes; ask your health care provider or pharmacist if you have questions. COMMON BRAND NAME(S): Venofer What should I tell my care team before I take this medication? They need to know if you have any of these conditions: Anemia not caused by low iron levels Heart disease High levels of iron in the blood Kidney disease Liver disease An unusual or allergic reaction to iron, other medications, foods, dyes, or preservatives Pregnant or trying to get pregnant Breastfeeding How should I use this medication? This medication is for infusion into a vein. It is given in a hospital or clinic setting. Talk to your care team about the use of this medication in children. While this medication may be prescribed for children as young as 2 years for selected conditions, precautions do apply. Overdosage: If you think you have taken too much of this medicine contact a poison control center or emergency room at once. NOTE: This medicine is only for you. Do not share this medicine with others. What if I miss a dose? Keep appointments for follow-up doses. It is important not to miss your dose. Call your care team if you are unable to keep an appointment. What may interact with this medication? Do not take this medication with any of the following: Deferoxamine Dimercaprol Other iron products This medication may also interact with the following: Chloramphenicol Deferasirox This list may not describe all possible interactions. Give your health care provider a list of all the medicines, herbs, non-prescription drugs, or dietary supplements you use. Also tell them if you smoke,  drink alcohol, or use illegal drugs. Some items may interact with your medicine. What should I watch for while using this medication? Visit your care team regularly. Tell your care team if your symptoms do not start to get better or if they get worse. You may need blood work done while you are taking this medication. You may need to follow a special diet. Talk to your care team. Foods that contain iron include: whole grains/cereals, dried fruits, beans, or peas, leafy green vegetables, and organ meats (liver, kidney). What side effects may I notice from receiving this medication? Side effects that you should report to your care team as soon as possible: Allergic reactions--skin rash, itching, hives, swelling of the face, lips, tongue, or throat Low blood pressure--dizziness, feeling faint or lightheaded, blurry vision Shortness of breath Side effects that usually do not require medical attention (report to your care team if they continue or are bothersome): Flushing Headache Joint pain Muscle pain Nausea Pain, redness, or irritation at injection site This list may not describe all possible side effects. Call your doctor for medical advice about side effects. You may report side effects to FDA at 1-800-FDA-1088. Where should I keep my medication? This medication is given in a hospital or clinic. It will not be stored at home. NOTE: This sheet is a summary. It may not cover all possible information. If you have questions about this medicine, talk to your doctor, pharmacist, or health care provider.  2024 Elsevier/Gold Standard (2022-10-15 00:00:00)

## 2023-05-16 ENCOUNTER — Inpatient Hospital Stay: Payer: Self-pay

## 2023-05-16 VITALS — BP 159/73 | HR 50 | Temp 97.5°F | Resp 18

## 2023-05-16 DIAGNOSIS — D509 Iron deficiency anemia, unspecified: Secondary | ICD-10-CM

## 2023-05-16 MED ORDER — SODIUM CHLORIDE 0.9% FLUSH
10.0000 mL | Freq: Once | INTRAVENOUS | Status: DC | PRN
Start: 2023-05-16 — End: 2023-05-16
  Filled 2023-05-16: qty 10

## 2023-05-16 MED ORDER — IRON SUCROSE 20 MG/ML IV SOLN: 200.0000 mg | Freq: Once | INTRAVENOUS | Status: DC

## 2023-05-16 NOTE — Progress Notes (Signed)
Unable to obtain IV access. Pt will reschedule infusion appt. Pt stable.

## 2023-05-27 ENCOUNTER — Inpatient Hospital Stay: Payer: Self-pay | Attending: Oncology

## 2023-05-27 VITALS — BP 155/91 | HR 51 | Temp 96.5°F | Resp 16

## 2023-05-27 DIAGNOSIS — Z79899 Other long term (current) drug therapy: Secondary | ICD-10-CM | POA: Insufficient documentation

## 2023-05-27 DIAGNOSIS — D509 Iron deficiency anemia, unspecified: Secondary | ICD-10-CM | POA: Insufficient documentation

## 2023-05-27 MED ORDER — IRON SUCROSE 20 MG/ML IV SOLN
200.0000 mg | Freq: Once | INTRAVENOUS | Status: AC
  Administered 2023-05-27: 200 mg via INTRAVENOUS
  Filled 2023-05-27: qty 10

## 2023-05-27 NOTE — Patient Instructions (Signed)
 Iron Sucrose Injection What is this medication? IRON SUCROSE (EYE ern SOO krose) treats low levels of iron (iron deficiency anemia) in people with kidney disease. Iron is a mineral that plays an important role in making red blood cells, which carry oxygen from your lungs to the rest of your body. This medicine may be used for other purposes; ask your health care provider or pharmacist if you have questions. COMMON BRAND NAME(S): Venofer What should I tell my care team before I take this medication? They need to know if you have any of these conditions: Anemia not caused by low iron levels Heart disease High levels of iron in the blood Kidney disease Liver disease An unusual or allergic reaction to iron, other medications, foods, dyes, or preservatives Pregnant or trying to get pregnant Breastfeeding How should I use this medication? This medication is for infusion into a vein. It is given in a hospital or clinic setting. Talk to your care team about the use of this medication in children. While this medication may be prescribed for children as young as 2 years for selected conditions, precautions do apply. Overdosage: If you think you have taken too much of this medicine contact a poison control center or emergency room at once. NOTE: This medicine is only for you. Do not share this medicine with others. What if I miss a dose? Keep appointments for follow-up doses. It is important not to miss your dose. Call your care team if you are unable to keep an appointment. What may interact with this medication? Do not take this medication with any of the following: Deferoxamine Dimercaprol Other iron products This medication may also interact with the following: Chloramphenicol Deferasirox This list may not describe all possible interactions. Give your health care provider a list of all the medicines, herbs, non-prescription drugs, or dietary supplements you use. Also tell them if you smoke,  drink alcohol, or use illegal drugs. Some items may interact with your medicine. What should I watch for while using this medication? Visit your care team regularly. Tell your care team if your symptoms do not start to get better or if they get worse. You may need blood work done while you are taking this medication. You may need to follow a special diet. Talk to your care team. Foods that contain iron include: whole grains/cereals, dried fruits, beans, or peas, leafy green vegetables, and organ meats (liver, kidney). What side effects may I notice from receiving this medication? Side effects that you should report to your care team as soon as possible: Allergic reactions--skin rash, itching, hives, swelling of the face, lips, tongue, or throat Low blood pressure--dizziness, feeling faint or lightheaded, blurry vision Shortness of breath Side effects that usually do not require medical attention (report to your care team if they continue or are bothersome): Flushing Headache Joint pain Muscle pain Nausea Pain, redness, or irritation at injection site This list may not describe all possible side effects. Call your doctor for medical advice about side effects. You may report side effects to FDA at 1-800-FDA-1088. Where should I keep my medication? This medication is given in a hospital or clinic. It will not be stored at home. NOTE: This sheet is a summary. It may not cover all possible information. If you have questions about this medicine, talk to your doctor, pharmacist, or health care provider.  2024 Elsevier/Gold Standard (2022-10-15 00:00:00)

## 2023-05-27 NOTE — Progress Notes (Signed)
 Declined 30 minute post-observation. Aware of risks. Vitals stable at discharge.

## 2023-08-11 ENCOUNTER — Other Ambulatory Visit: Payer: Self-pay | Admitting: *Deleted

## 2023-08-11 DIAGNOSIS — D509 Iron deficiency anemia, unspecified: Secondary | ICD-10-CM

## 2023-08-12 ENCOUNTER — Inpatient Hospital Stay: Payer: Self-pay

## 2023-08-15 ENCOUNTER — Inpatient Hospital Stay: Payer: Self-pay

## 2023-08-15 ENCOUNTER — Inpatient Hospital Stay: Payer: Self-pay | Admitting: Oncology

## 2023-08-15 ENCOUNTER — Inpatient Hospital Stay: Payer: Self-pay | Attending: Oncology

## 2023-08-15 DIAGNOSIS — Z79899 Other long term (current) drug therapy: Secondary | ICD-10-CM | POA: Insufficient documentation

## 2023-08-15 DIAGNOSIS — I10 Essential (primary) hypertension: Secondary | ICD-10-CM | POA: Insufficient documentation

## 2023-08-15 DIAGNOSIS — D509 Iron deficiency anemia, unspecified: Secondary | ICD-10-CM | POA: Insufficient documentation

## 2023-08-15 LAB — CBC WITH DIFFERENTIAL/PLATELET
Abs Immature Granulocytes: 0.02 10*3/uL (ref 0.00–0.07)
Basophils Absolute: 0 10*3/uL (ref 0.0–0.1)
Basophils Relative: 1 %
Eosinophils Absolute: 0.1 10*3/uL (ref 0.0–0.5)
Eosinophils Relative: 1 %
HCT: 40.8 % (ref 36.0–46.0)
Hemoglobin: 12.6 g/dL (ref 12.0–15.0)
Immature Granulocytes: 1 %
Lymphocytes Relative: 31 %
Lymphs Abs: 1.3 10*3/uL (ref 0.7–4.0)
MCH: 26.6 pg (ref 26.0–34.0)
MCHC: 30.9 g/dL (ref 30.0–36.0)
MCV: 86.1 fL (ref 80.0–100.0)
Monocytes Absolute: 0.5 10*3/uL (ref 0.1–1.0)
Monocytes Relative: 13 %
Neutro Abs: 2.3 10*3/uL (ref 1.7–7.7)
Neutrophils Relative %: 53 %
Platelets: 354 10*3/uL (ref 150–400)
RBC: 4.74 MIL/uL (ref 3.87–5.11)
RDW: 13.2 % (ref 11.5–15.5)
WBC: 4.3 10*3/uL (ref 4.0–10.5)
nRBC: 0 % (ref 0.0–0.2)

## 2023-08-15 LAB — IRON AND TIBC
Iron: 60 ug/dL (ref 28–170)
Saturation Ratios: 12 % (ref 10.4–31.8)
TIBC: 501 ug/dL — ABNORMAL HIGH (ref 250–450)
UIBC: 441 ug/dL

## 2023-08-15 LAB — FERRITIN: Ferritin: 10 ng/mL — ABNORMAL LOW (ref 11–307)

## 2023-08-17 ENCOUNTER — Encounter: Payer: Self-pay | Admitting: Oncology

## 2023-08-17 ENCOUNTER — Inpatient Hospital Stay: Payer: Self-pay

## 2023-08-17 ENCOUNTER — Inpatient Hospital Stay (HOSPITAL_BASED_OUTPATIENT_CLINIC_OR_DEPARTMENT_OTHER): Payer: Self-pay | Admitting: Oncology

## 2023-08-17 VITALS — BP 157/101 | HR 69 | Temp 98.1°F | Resp 18 | Ht 62.0 in | Wt 188.5 lb

## 2023-08-17 DIAGNOSIS — D509 Iron deficiency anemia, unspecified: Secondary | ICD-10-CM

## 2023-08-17 NOTE — Progress Notes (Signed)
 [facility]  [phone] [phone]  ID: Chelsea West OB: 11-03-1975  MR#: [id number]  BMW#:[id number]  Patient Care Team: Enid Baas, MD as PCP - General (Internal Medicine) Jeralyn Ruths, MD as Consulting Physician (Oncology)  CHIEF COMPLAINT: Iron deficiency anemia.  INTERVAL HISTORY: Patient returns to clinic today for repeat laboratory work, further evaluation, and consideration of additional IV Venofer.  She continues to have chronic fatigue, but otherwise feels well.  She denies any recent fevers or illnesses.  She has a good appetite and denies weight loss.  She has no chest pain, shortness of breath, cough, or hemoptysis.  She denies any nausea, vomiting, constipation, or diarrhea.  She has no known or hematochezia.  She has no urinary complaints.  Patient offers no further specific complaints today.  REVIEW OF SYSTEMS:   Review of Systems  Constitutional:  Positive for malaise/fatigue. Negative for fever and weight loss.  Respiratory: Negative.  Negative for cough, hemoptysis and shortness of breath.   Cardiovascular: Negative.  Negative for chest pain and leg swelling.  Gastrointestinal:  Negative for abdominal pain, blood in stool and melena.  Genitourinary: Negative.  Negative for hematuria.  Musculoskeletal: Negative.  Negative for back pain, falls and myalgias.  Skin: Negative.  Negative for rash.  Neurological: Negative.  Negative for dizziness, focal weakness, weakness and headaches.  Psychiatric/Behavioral: Negative.  The patient is not nervous/anxious.     As per HPI. Otherwise, a complete review of systems is negative.  PAST MEDICAL HISTORY: Past Medical History:  Diagnosis Date   Anemia    Bronchitis, chronic (HCC)    Diabetes mellitus without complication (HCC)    Hypertension    Tubal pregnancy     PAST SURGICAL HISTORY: Past Surgical History:  Procedure Laterality Date   LAPAROSCOPIC GASTRIC  SLEEVE RESECTION     SALPINGOSTOMY      FAMILY HISTORY: History reviewed. No pertinent family history.  ADVANCED DIRECTIVES (Y/N):  N  HEALTH MAINTENANCE: Social History   Tobacco Use   Smoking status: Never   Smokeless tobacco: Never  Vaping Use   Vaping status: Never Used  Substance Use Topics   Alcohol use: No   Drug use: No     Colonoscopy:  PAP:  Bone density:  Lipid panel:  Allergies  Allergen Reactions   Penicillins Shortness Of Breath    Current Outpatient Medications  Medication Sig Dispense Refill   amLODipine (NORVASC) 10 MG tablet Take 10 mg by mouth daily. (Patient not taking: Reported on 04/21/2022)     ferrous sulfate 325 (65 FE) MG EC tablet Take 325 mg by mouth 3 (three) times daily with meals. (Patient not taking: Reported on 12/09/2022)     metFORMIN (GLUCOPHAGE) 500 MG tablet Take 500 mg by mouth 2 (two) times daily with a meal. (Patient not taking: Reported on 04/21/2022)     naproxen (NAPROSYN) 500 MG tablet Take 1 tablet (500 mg total) by mouth 2 (two) times daily. (Patient not taking: Reported on 08/17/2023) 20 tablet 0   omeprazole (PRILOSEC) 20 MG capsule Take 20 mg by mouth daily. (Patient not taking: Reported on 04/21/2022)     ondansetron (ZOFRAN) 4 MG tablet Take 1 tablet (4 mg total) by mouth every 6 (six) hours. (Patient not taking: Reported on 08/17/2023) 12 tablet 0   Vitamin D, Ergocalciferol, (DRISDOL) 1.25 MG (50000 UNIT) CAPS capsule Take 50,000 Units by mouth every 7 (seven) days. (Patient not taking: Reported on 08/17/2023)  No current facility-administered medications for this visit.    OBJECTIVE: Vitals:   08/17/23 1321  BP: (!) 157/101  Pulse: 69  Resp: 18  Temp: 98.1 F (36.7 C)  SpO2: 100%     Body mass index is 34.48 kg/m.    ECOG FS:0 - Asymptomatic  General: Well-developed, well-nourished, no acute distress. Eyes: Pink conjunctiva, anicteric sclera. HEENT: Normocephalic, moist mucous membranes. Lungs: No  audible wheezing or coughing. Heart: Regular rate and rhythm. Abdomen: Soft, nontender, no obvious distention. Musculoskeletal: No edema, cyanosis, or clubbing. Neuro: Alert, answering all questions appropriately. Cranial nerves grossly intact. Skin: No rashes or petechiae noted. Psych: Normal affect.  LAB RESULTS:  Lab Results  Component Value Date   NA 136 01/07/2023   K 3.5 01/07/2023   CL 97 (L) 01/07/2023   CO2 24 01/07/2023   GLUCOSE 82 01/07/2023   BUN 12 01/07/2023   CREATININE 0.69 01/07/2023   CALCIUM 8.8 (L) 01/07/2023   PROT 7.9 07/19/2022   ALBUMIN 4.0 07/19/2022   AST 16 07/19/2022   ALT 8 07/19/2022   ALKPHOS 55 07/19/2022   BILITOT 1.7 (H) 07/19/2022   GFRNONAA >60 01/07/2023   GFRAA 53 (L) 05/16/2019    Lab Results  Component Value Date   WBC 4.3 08/15/2023   NEUTROABS 2.3 08/15/2023   HGB 12.6 08/15/2023   HCT 40.8 08/15/2023   MCV 86.1 08/15/2023   PLT 354 08/15/2023   Lab Results  Component Value Date   IRON 60 08/15/2023   TIBC 501 (H) 08/15/2023   IRONPCTSAT 12 08/15/2023    Lab Results  Component Value Date   FERRITIN 10 (L) 08/15/2023     STUDIES: No results found.  ASSESSMENT: Iron deficiency anemia.  PLAN:    Iron deficiency anemia: Resolved.  Patient has a history of gastric sleeve and likely has poor absorption contributing.  Her ferritin remains were mildly decreased, but her hemoglobin and the remainder of her iron panel is within normal limits. Previously, all of her other laboratory work was either negative or within normal limits including hemoglobinopathy profile.  She does not require additional Venofer today.  Patient last received treatment on May 27, 2023.  No intervention is needed.  Return to clinic in 4 months with repeat laboratory, further evaluation, and consideration of additional IV Venofer if necessary.    Hypertension: Chronic and unchanged.  Patient's blood pressure is moderately elevated today.  Continue  monitoring and treatment per primary care.    I spent a total of 20 minutes reviewing chart data, face-to-face evaluation with the patient, counseling and coordination of care as detailed above.  Patient expressed understanding and was in agreement with this plan. She also understands that She can call clinic at any time with any questions, concerns, or complaints.     Jeralyn Ruths, MD   08/17/2023 1:46 PM

## 2023-08-28 ENCOUNTER — Encounter (HOSPITAL_COMMUNITY): Payer: Self-pay

## 2023-08-28 ENCOUNTER — Other Ambulatory Visit: Payer: Self-pay

## 2023-08-28 ENCOUNTER — Emergency Department (HOSPITAL_COMMUNITY)
Admission: EM | Admit: 2023-08-28 | Discharge: 2023-08-28 | Disposition: A | Discharge status: Home / Self Care | Payer: Self-pay | Attending: Emergency Medicine | Admitting: Emergency Medicine

## 2023-08-28 DIAGNOSIS — N939 Abnormal uterine and vaginal bleeding, unspecified: Secondary | ICD-10-CM

## 2023-08-28 LAB — URINALYSIS, ROUTINE W REFLEX MICROSCOPIC
Bacteria, UA: NONE SEEN
Bilirubin Urine: NEGATIVE
Glucose, UA: NEGATIVE mg/dL
Ketones, ur: NEGATIVE mg/dL
Leukocytes,Ua: NEGATIVE
Nitrite: NEGATIVE
Protein, ur: 100 mg/dL — AB
RBC / HPF: >50 RBC/hpf (ref 0–5)
Specific Gravity, Urine: 1.026 (ref 1.005–1.030)
pH: 5 (ref 5.0–8.0)

## 2023-08-28 LAB — CBC
HCT: 36.9 % (ref 36.0–46.0)
Hemoglobin: 11.6 g/dL — ABNORMAL LOW (ref 12.0–15.0)
MCH: 26.9 pg (ref 26.0–34.0)
MCHC: 31.4 g/dL (ref 30.0–36.0)
MCV: 85.6 fL (ref 80.0–100.0)
Platelets: 337 10*3/uL (ref 150–400)
RBC: 4.31 MIL/uL (ref 3.87–5.11)
RDW: 13.3 % (ref 11.5–15.5)
WBC: 4.4 10*3/uL (ref 4.0–10.5)
nRBC: 0 % (ref 0.0–0.2)

## 2023-08-28 LAB — WET PREP, GENITAL
Clue Cells Wet Prep HPF POC: NONE SEEN
Sperm: NONE SEEN
Trich, Wet Prep: NONE SEEN
WBC, Wet Prep HPF POC: <10 — AB (ref <10)
Yeast Wet Prep HPF POC: NONE SEEN

## 2023-08-28 LAB — BASIC METABOLIC PANEL WITH GFR
Anion gap: 12 (ref 5–15)
BUN: 11 mg/dL (ref 6–20)
CO2: 22 mmol/L (ref 22–32)
Calcium: 8.8 mg/dL — ABNORMAL LOW (ref 8.9–10.3)
Chloride: 104 mmol/L (ref 98–111)
Creatinine, Ser: 0.74 mg/dL (ref 0.44–1.00)
GFR, Estimated: >60 mL/min (ref >60)
Glucose, Bld: 78 mg/dL (ref 70–99)
Potassium: 3.3 mmol/L — ABNORMAL LOW (ref 3.5–5.1)
Sodium: 138 mmol/L (ref 135–145)

## 2023-08-28 LAB — PREGNANCY, URINE: Preg Test, Ur: NEGATIVE

## 2023-08-28 NOTE — Discharge Instructions (Addendum)
 Return to emergency room if you feeling dizziness, lightheadedness.  If you are filling multiple pads per hour. Follow up with primary care. Labs today are overall reassuring, but if symptoms progress you may need to return.

## 2023-08-28 NOTE — ED Provider Notes (Signed)
 [place] EMERGENCY DEPARTMENT AT [facility] Provider Note   CSN: [id number] Arrival date & time: 08/28/23  1534     History {Add pertinent medical, surgical, social history, OB history to HPI:1} Chief Complaint  Patient presents with   Vaginal Bleeding    URIJAH West is a 48 y.o. female.  With past medical history of iron deficiency anemia presenting to emergency room with vaginal bleeding.  Patient reports that her vaginal bleeding started 2 days ago.  She reports she has had intermittent periods over past year.  Reports that she skipped her period last month, now feels like her vaginal bleeding is much worse than normal. Has been filling 6-7 pads a DAY. Not filling multiple pads per hour. Denies clots or pain.  Nuys any dizziness, lightheadedness.  She reports she is sexually active.  Denies any dysuria or vaginal discharge.   Vaginal Bleeding      Home Medications Prior to Admission medications   Medication Sig Start Date End Date Taking? Authorizing Provider  amLODipine (NORVASC) 10 MG tablet Take 10 mg by mouth daily. Patient not taking: Reported on 04/21/2022    [provider]  ferrous sulfate 325 (65 FE) MG EC tablet Take 325 mg by mouth 3 (three) times daily with meals. Patient not taking: Reported on 12/09/2022    [provider]  metFORMIN (GLUCOPHAGE) 500 MG tablet Take 500 mg by mouth 2 (two) times daily with a meal. Patient not taking: Reported on 04/21/2022    [provider]  naproxen (NAPROSYN) 500 MG tablet Take 1 tablet (500 mg total) by mouth 2 (two) times daily. Patient not taking: Reported on 08/17/2023 01/07/23   Bethann Berkshire, MD  omeprazole (PRILOSEC) 20 MG capsule Take 20 mg by mouth daily. Patient not taking: Reported on 04/21/2022    [provider]  ondansetron (ZOFRAN) 4 MG tablet Take 1 tablet (4 mg total) by mouth every 6 (six) hours. Patient not taking: Reported on 08/17/2023 04/21/22   Gareth Eagle, PA-C  Vitamin D, Ergocalciferol, (DRISDOL) 1.25 MG (50000 UNIT) CAPS capsule Take 50,000 Units by mouth every 7 (seven) days. Patient not taking: Reported on 08/17/2023    [provider]      Allergies    Penicillins    Review of Systems   Review of Systems  Genitourinary:  Positive for vaginal bleeding.    Physical Exam Updated Vital Signs BP (!) 163/101 (BP Location: Right Arm)   Pulse 80   Temp (!) 97.5 F (36.4 C)   Resp 20   Ht 5\' 2"  (1.575 m)   Wt 85.3 kg   LMP 08/28/2023 (Exact Date)   SpO2 100%   BMI 34.39 kg/m  Physical Exam  ED Results / Procedures / Treatments   Labs (all labs ordered are listed, but only abnormal results are displayed) Labs Reviewed  WET PREP, GENITAL - Abnormal; Notable for the following components:      Result Value   WBC, Wet Prep HPF POC <10 (*)    All other components within normal limits  BASIC METABOLIC PANEL WITH GFR - Abnormal; Notable for the following components:   Potassium 3.3 (*)    Calcium 8.8 (*)    All other components within normal limits  CBC - Abnormal; Notable for the following components:   Hemoglobin 11.6 (*)    All other components within normal limits  URINALYSIS, ROUTINE W REFLEX MICROSCOPIC - Abnormal; Notable for the following components:  Color, Urine AMBER (*)    APPearance CLOUDY (*)    Hgb urine dipstick LARGE (*)    Protein, ur 100 (*)    All other components within normal limits  PREGNANCY, URINE  GC/CHLAMYDIA PROBE AMP ([place]) NOT AT [facility]    EKG None  Radiology No results found.  Procedures Procedures  {Document cardiac monitor, telemetry assessment procedure when appropriate:1}  Medications Ordered in ED Medications - No data to display  ED Course/ Medical Decision Making/ A&P   {   Click here for ABCD2, HEART and other calculatorsREFRESH Note before signing :1}                              Medical Decision Making Amount and/or Complexity of Data  Reviewed Labs: ordered.   This patient presents to the ED for concern of vaginal bleeding, this involves an extensive number of treatment options, and is a complaint that carries with it a high risk of complications and morbidity.  The differential diagnosis includes ***   Co morbidities that complicate the patient evaluation  ***   Additional history obtained:  Additional history obtained from ***   Lab Tests:  Chelsea personally interpreted labs.  The pertinent results include:  ***   Imaging Studies ordered:  Chelsea ordered imaging studies including ***  Chelsea independently visualized and interpreted imaging which showed *** Chelsea agree with the radiologist interpretation   Cardiac Monitoring: / EKG:  The patient was maintained on a cardiac monitor.  Chelsea personally viewed and interpreted the cardiac monitored which showed an underlying rhythm of: ***   Consultations Obtained:  Chelsea requested consultation with the ***,  and discussed lab and imaging findings as well as pertinent plan - they recommend: ***   Problem List / ED Course / Critical interventions / Medication management  *** Chelsea ordered medication including ***  for ***  Reevaluation of the patient after these medicines showed that the patient {resolved/improved/worsened:23923::"improved"} Chelsea have reviewed the patients home medicines and have made adjustments as needed   Plan  F/u w/ PCP in 2-3d to ensure resolution of sx.  Patient was given return precautions. Patient stable for discharge at this time.  Patient educated on sx/dx and verbalized understanding of plan. Return to ER w/ new or worsening sx.    {Document critical care time when appropriate:1} {Document review of labs and clinical decision tools ie heart score, Chads2Vasc2 etc:1}  {Document your independent review of radiology images, and any outside records:1} {Document your discussion with family members, caretakers, and with consultants:1} {Document social  determinants of health affecting pt's care:1} {Document your decision making why or why not admission, treatments were needed:1} Final Clinical Impression(s) / ED Diagnoses Final diagnoses:  None    Rx / DC Orders ED Discharge Orders     None

## 2023-08-28 NOTE — ED Triage Notes (Signed)
 Pt stated that she began bleeding vaginally on Fri. Pt stated she is going through 6 pads a day. Pt does still have menstrual cycle. Pt is symptomatic anemia and gets iron infusions

## 2023-08-31 LAB — GC/CHLAMYDIA PROBE AMP (~~LOC~~) NOT AT ARMC
Chlamydia: NEGATIVE
Comment: NEGATIVE
Comment: NORMAL
Neisseria Gonorrhea: NEGATIVE

## 2023-10-14 IMAGING — US US EXTREM  UP VENOUS*L*
1 series · 13 of 24 positions shown · non-contrast
Comparison: None.

CLINICAL DATA: Palpable cord involving the left antecubital fossa.
Evaluate for DVT.



[Series 1: us venous img upper uni left (dvt) · portal-venous · 13 of 51 slices shown]
[im 1/51]
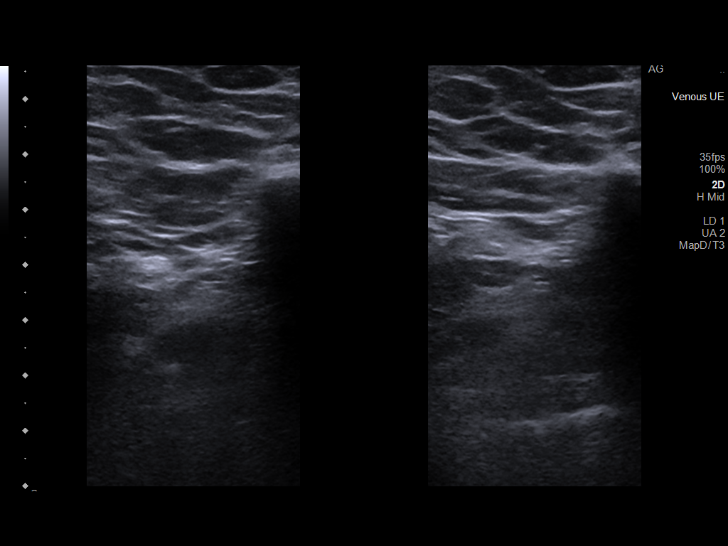
[im 5/51]
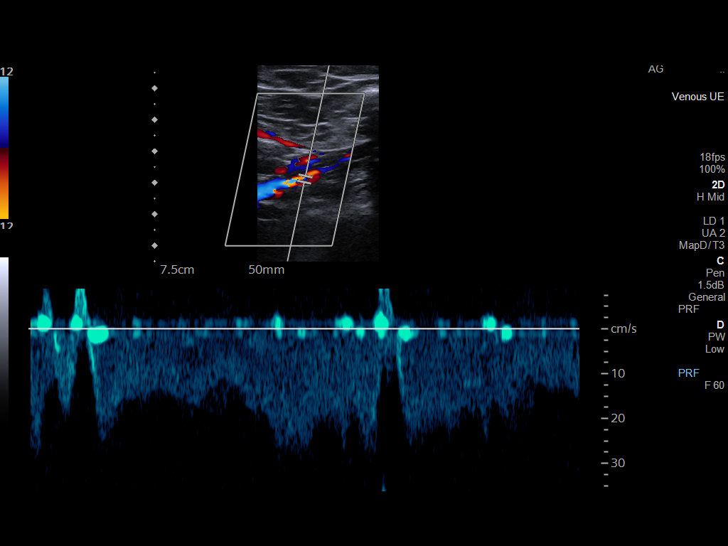
[im 9/51]
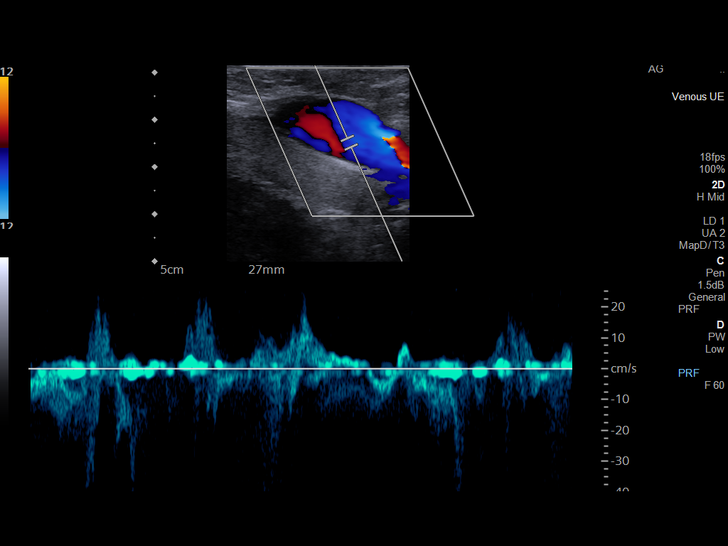
[im 14/51]
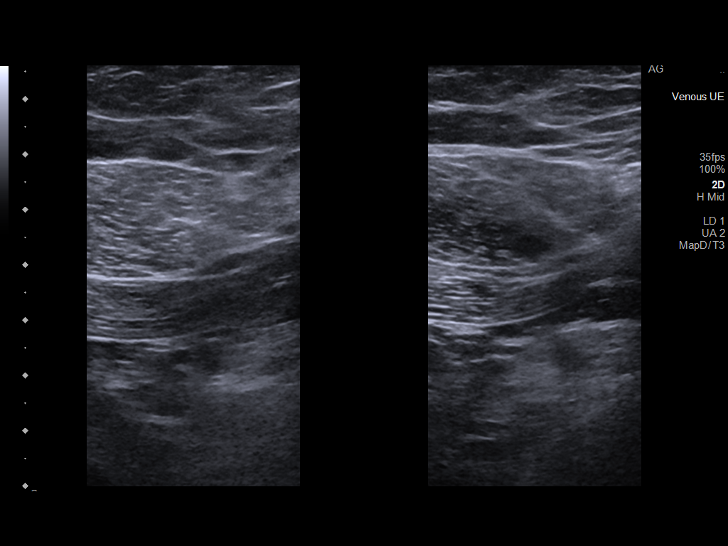
[im 18/51]
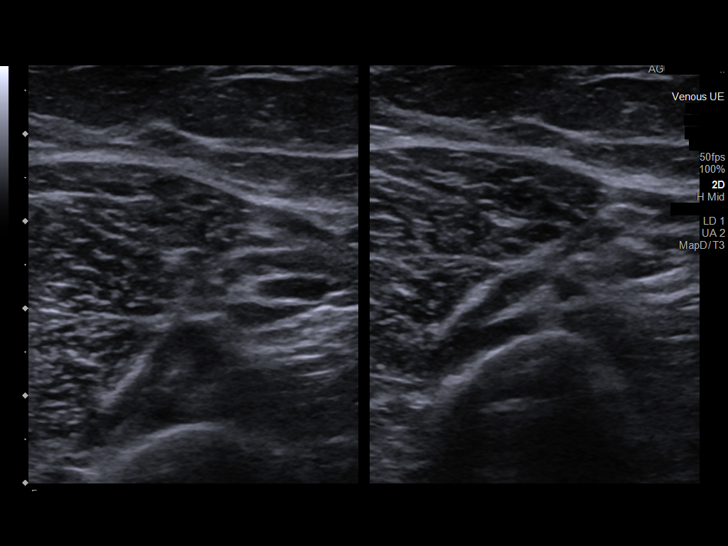
[im 22/51]
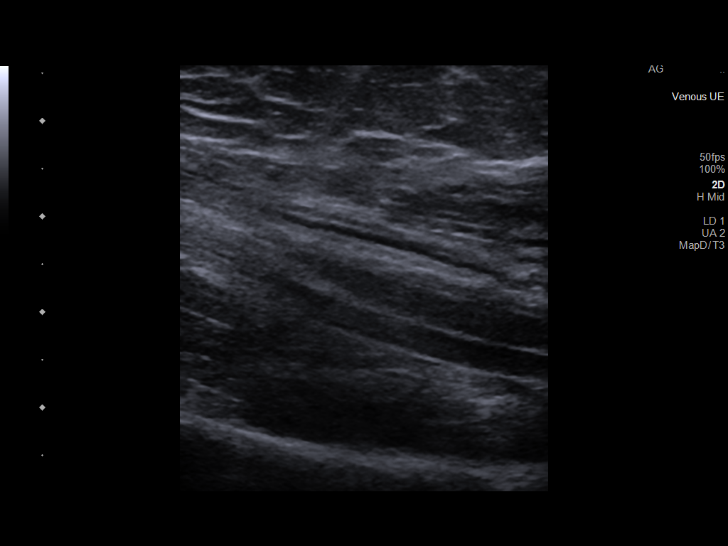
[im 27/51]
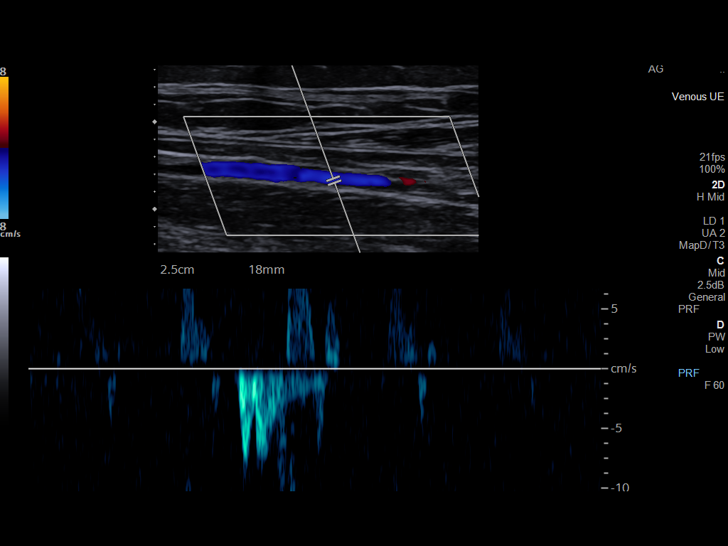
[im 29/51]
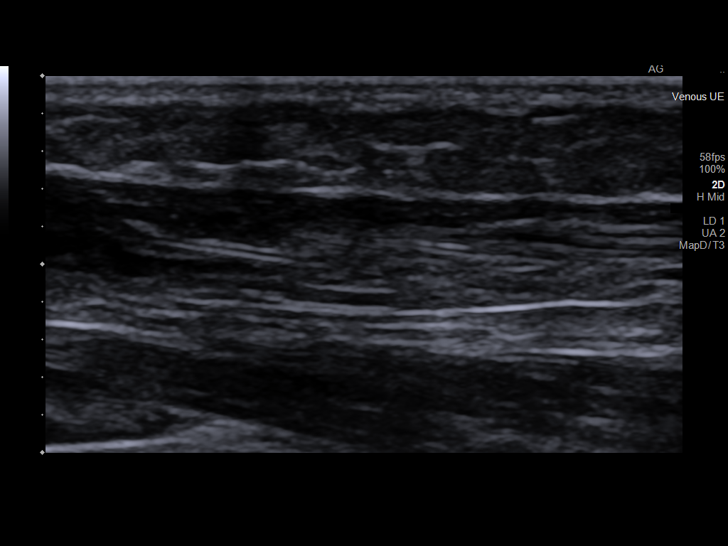
[im 33/51]
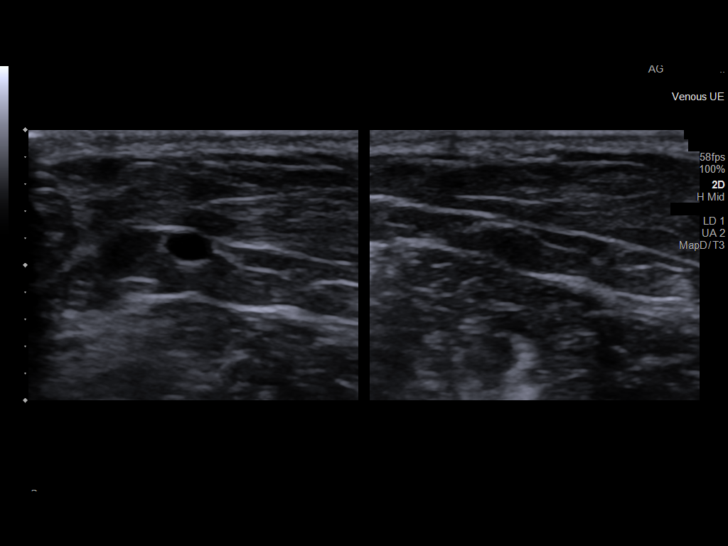
[im 37/51]
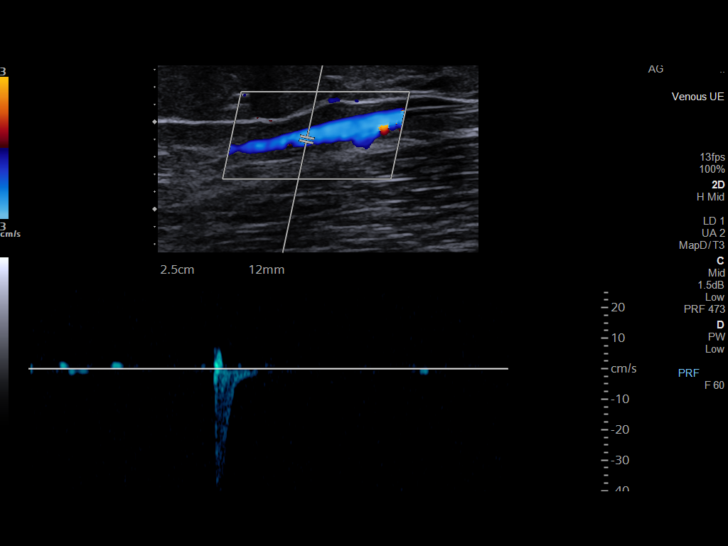
[im 42/51]
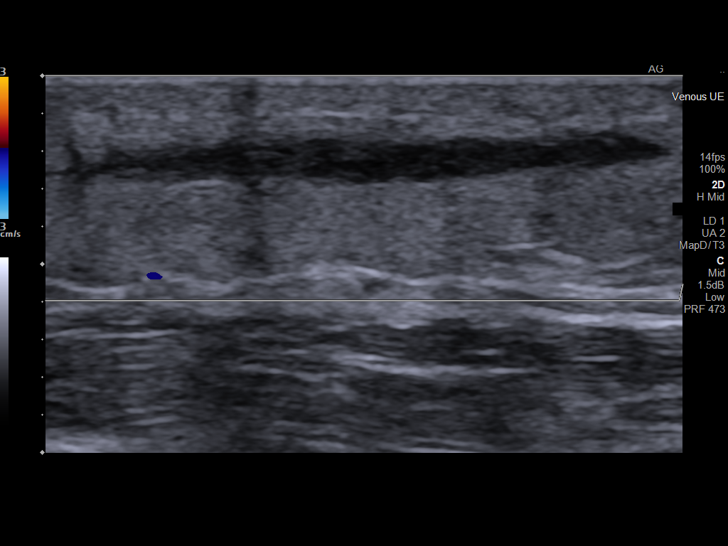
[im 46/51]
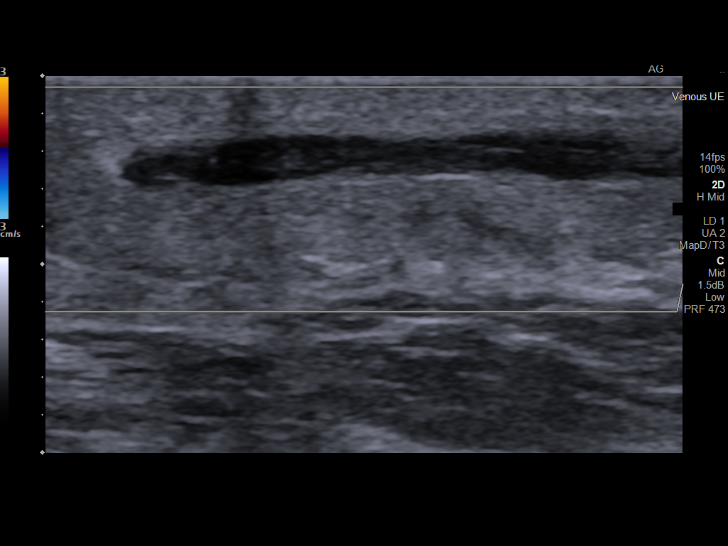
[im 51/51]
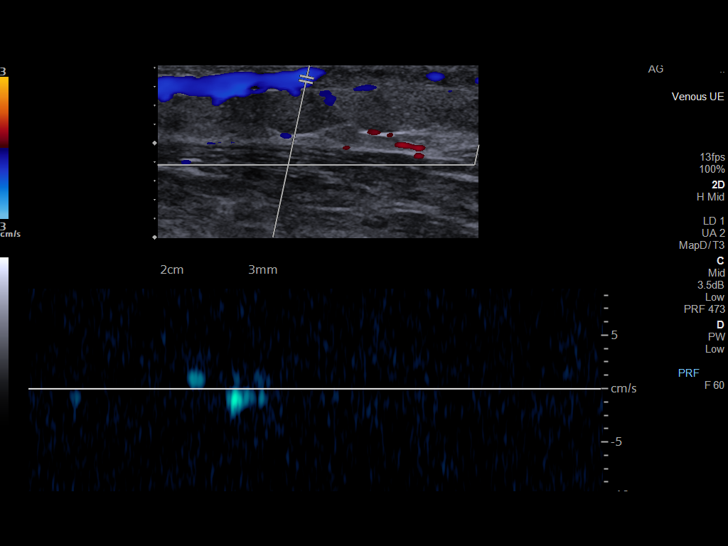

[13 of 24 positions shown; findings below may reference images not displayed]

FINDINGS: Contralateral Subclavian Vein: Respiratory phasicity is normal and
symmetric with the symptomatic side. No evidence of thrombus. Normal
compressibility.

Internal Jugular Vein: No evidence of thrombus. Normal
compressibility, respiratory phasicity and response to augmentation.

Subclavian Vein: No evidence of thrombus. Normal compressibility,
respiratory phasicity and response to augmentation.

Axillary Vein: No evidence of thrombus. Normal compressibility,
respiratory phasicity and response to augmentation.

Cephalic Vein: No evidence of thrombus. Normal compressibility,
respiratory phasicity and response to augmentation.

Basilic Vein: While the basilic vein appears widely patent
proximally (image 39), there is hypoechoic occlusive thrombus
involving the basilic vein at the level of the antecubital fossa and
proximal forearm (images 40 through 48). The basilic vein appears
patent more distally (image 51).

Brachial Veins: No evidence of thrombus. Normal compressibility,
respiratory phasicity and response to augmentation.

Radial Veins: No evidence of thrombus. Normal compressibility,
respiratory phasicity and response to augmentation.

Ulnar Veins: No evidence of thrombus. Normal compressibility,
respiratory phasicity and response to augmentation.

Other Findings:  None visualized.
IMPRESSION: 1. No evidence of DVT within the left upper extremity.
2. The examination is positive for short-segment occlusive
superficial thrombophlebitis involving the basilic vein at the level
of the antecubital fossa and proximal forearm.

## 2023-11-21 ENCOUNTER — Encounter (HOSPITAL_COMMUNITY): Payer: Self-pay

## 2023-11-21 ENCOUNTER — Other Ambulatory Visit: Payer: Self-pay

## 2023-11-21 ENCOUNTER — Emergency Department (HOSPITAL_COMMUNITY)
Admission: EM | Admit: 2023-11-21 | Discharge: 2023-11-21 | Disposition: A | Discharge status: Home / Self Care | Payer: Self-pay | Attending: Emergency Medicine | Admitting: Emergency Medicine

## 2023-11-21 DIAGNOSIS — R110 Nausea: Secondary | ICD-10-CM

## 2023-11-21 DIAGNOSIS — R197 Diarrhea, unspecified: Secondary | ICD-10-CM | POA: Insufficient documentation

## 2023-11-21 DIAGNOSIS — J069 Acute upper respiratory infection, unspecified: Secondary | ICD-10-CM | POA: Insufficient documentation

## 2023-11-21 DIAGNOSIS — R112 Nausea with vomiting, unspecified: Secondary | ICD-10-CM | POA: Insufficient documentation

## 2023-11-21 LAB — COMPREHENSIVE METABOLIC PANEL WITH GFR
ALT: 10 U/L (ref 0–44)
AST: 18 U/L (ref 15–41)
Albumin: 3.8 g/dL (ref 3.5–5.0)
Alkaline Phosphatase: 65 U/L (ref 38–126)
Anion gap: 12 (ref 5–15)
BUN: 11 mg/dL (ref 6–20)
CO2: 24 mmol/L (ref 22–32)
Calcium: 9 mg/dL (ref 8.9–10.3)
Chloride: 102 mmol/L (ref 98–111)
Creatinine, Ser: 0.82 mg/dL (ref 0.44–1.00)
GFR, Estimated: >60 mL/min (ref >60)
Glucose, Bld: 82 mg/dL (ref 70–99)
Potassium: 3.7 mmol/L (ref 3.5–5.1)
Sodium: 138 mmol/L (ref 135–145)
Total Bilirubin: 1.2 mg/dL (ref 0.0–1.2)
Total Protein: 7.6 g/dL (ref 6.5–8.1)

## 2023-11-21 LAB — CBC WITH DIFFERENTIAL/PLATELET
Abs Immature Granulocytes: 0.02 10*3/uL (ref 0.00–0.07)
Basophils Absolute: 0 10*3/uL (ref 0.0–0.1)
Basophils Relative: 0 %
Eosinophils Absolute: 0 10*3/uL (ref 0.0–0.5)
Eosinophils Relative: 1 %
HCT: 34.4 % — ABNORMAL LOW (ref 36.0–46.0)
Hemoglobin: 10.1 g/dL — ABNORMAL LOW (ref 12.0–15.0)
Immature Granulocytes: 0 %
Lymphocytes Relative: 21 %
Lymphs Abs: 1 10*3/uL (ref 0.7–4.0)
MCH: 23.3 pg — ABNORMAL LOW (ref 26.0–34.0)
MCHC: 29.4 g/dL — ABNORMAL LOW (ref 30.0–36.0)
MCV: 79.3 fL — ABNORMAL LOW (ref 80.0–100.0)
Monocytes Absolute: 0.5 10*3/uL (ref 0.1–1.0)
Monocytes Relative: 11 %
Neutro Abs: 3.3 10*3/uL (ref 1.7–7.7)
Neutrophils Relative %: 67 %
Platelets: 420 10*3/uL — ABNORMAL HIGH (ref 150–400)
RBC: 4.34 MIL/uL (ref 3.87–5.11)
RDW: 14.5 % (ref 11.5–15.5)
WBC: 5 10*3/uL (ref 4.0–10.5)
nRBC: 0 % (ref 0.0–0.2)

## 2023-11-21 LAB — RESP PANEL BY RT-PCR (RSV, FLU A&B, COVID)  RVPGX2
Influenza A by PCR: NEGATIVE
Influenza B by PCR: NEGATIVE
Resp Syncytial Virus by PCR: NEGATIVE
SARS Coronavirus 2 by RT PCR: NEGATIVE

## 2023-11-21 MED ORDER — ONDANSETRON 4 MG PO TBDP
4.0000 mg | ORAL_TABLET | Freq: Three times a day (TID) | ORAL | 0 refills | Status: AC | PRN
Start: 2023-11-21 — End: ?

## 2023-11-21 MED ORDER — METOCLOPRAMIDE HCL 10 MG PO TABS
10.0000 mg | ORAL_TABLET | Freq: Once | ORAL | Status: AC
  Administered 2023-11-21: 10 mg via ORAL
  Filled 2023-11-21: qty 1

## 2023-11-21 MED ORDER — KETOROLAC TROMETHAMINE 15 MG/ML IJ SOLN
15.0000 mg | Freq: Once | INTRAMUSCULAR | Status: AC
  Administered 2023-11-21: 15 mg via INTRAMUSCULAR
  Filled 2023-11-21: qty 1

## 2023-11-21 NOTE — ED Triage Notes (Signed)
 Pt arrived via POV c/o nasal congestion, sore throat, cough, body aches, nausea, diarrhea and reports symptoms began Saturday. Pt reports trying OTC medications w/o relief.

## 2023-11-21 NOTE — Discharge Instructions (Signed)
 You were seen for your upper respiratory tract infection in the emergency department.   At home, please use Tylenol  and ibuprofen for your muscle aches and fevers.  Please use over-the-counter cough medication or tea with honey for your cough. Take the zofran  for your nausea and vomiting. Make sure to stay well hydrated.   Follow-up with your primary doctor in 2-3 days regarding your visit.  This may be over the phone.  Return immediately to the emergency department if you experience any of the following: Difficulty breathing, or any other concerning symptoms.    Thank you for visiting our Emergency Department. It was a pleasure taking care of you today.

## 2023-11-21 NOTE — ED Provider Notes (Signed)
 [place] EMERGENCY DEPARTMENT AT [facility] Provider Note   CSN: [id number] Arrival date & time: 11/21/23  1122     Patient presents with: Nasal Congestion   Chelsea West is a 48 y.o. female.   48 year old female who presents emergency department with URI type symptoms.  Over the past 2 days patient has developed subjective fevers, chills, congestion, sore throat, and cough.  Also has had some myalgias.  Has been taking NyQuil and several other over-the-counter medications including ibuprofen and Tylenol  without relief.  Has had nausea with 1 episode of nonbloody nonbilious emesis and some diarrhea.  Does work at a group home.       Prior to Admission medications   Medication Sig Start Date End Date Taking? Authorizing Provider  ondansetron  (ZOFRAN -ODT) 4 MG disintegrating tablet Take 1 tablet (4 mg total) by mouth every 8 (eight) hours as needed for nausea or vomiting. 11/21/23  Yes Yolande Lamar BROCKS, MD  amLODipine (NORVASC) 10 MG tablet Take 10 mg by mouth daily. Patient not taking: Reported on 04/21/2022    [provider]  ferrous sulfate 325 (65 FE) MG EC tablet Take 325 mg by mouth 3 (three) times daily with meals. Patient not taking: Reported on 12/09/2022    [provider]  metFORMIN (GLUCOPHAGE) 500 MG tablet Take 500 mg by mouth 2 (two) times daily with a meal. Patient not taking: Reported on 04/21/2022    [provider]  naproxen  (NAPROSYN ) 500 MG tablet Take 1 tablet (500 mg total) by mouth 2 (two) times daily. Patient not taking: Reported on 08/17/2023 01/07/23   Zammit, Joseph, MD  omeprazole (PRILOSEC) 20 MG capsule Take 20 mg by mouth daily. Patient not taking: Reported on 04/21/2022    [provider]  ondansetron  (ZOFRAN ) 4 MG tablet Take 1 tablet (4 mg total) by mouth every 6 (six) hours. Patient not taking: Reported on 08/17/2023 04/21/22   Robinson, John K, PA-C  Vitamin D, Ergocalciferol, (DRISDOL) 1.25 MG  (50000 UNIT) CAPS capsule Take 50,000 Units by mouth every 7 (seven) days. Patient not taking: Reported on 08/17/2023    [provider]    Allergies: Penicillins    Review of Systems  Updated Vital Signs BP (!) 155/98   Pulse 73   Temp 97.9 F (36.6 C)   Resp 20   Ht 5' 2 (1.575 m)   Wt 85.3 kg   SpO2 100%   BMI 34.40 kg/m   Physical Exam Vitals and nursing note reviewed.  Constitutional:      General: She is not in acute distress.    Appearance: She is well-developed.  HENT:     Head: Normocephalic and atraumatic.     Right Ear: External ear normal.     Left Ear: External ear normal.     Nose: Nose normal.   Eyes:     Extraocular Movements: Extraocular movements intact.     Conjunctiva/sclera: Conjunctivae normal.     Pupils: Pupils are equal, round, and reactive to light.    Cardiovascular:     Rate and Rhythm: Normal rate and regular rhythm.     Heart sounds: No murmur heard. Pulmonary:     Effort: Pulmonary effort is normal. No respiratory distress.     Breath sounds: Normal breath sounds.   Musculoskeletal:     Cervical back: Normal range of motion and neck supple.   Skin:    General: Skin is warm and dry.   Neurological:  Mental Status: She is alert and oriented to person, place, and time. Mental status is at baseline.   Psychiatric:        Mood and Affect: Mood normal.     (all labs ordered are listed, but only abnormal results are displayed) Labs Reviewed  CBC WITH DIFFERENTIAL/PLATELET - Abnormal; Notable for the following components:      Result Value   Hemoglobin 10.1 (*)    HCT 34.4 (*)    MCV 79.3 (*)    MCH 23.3 (*)    MCHC 29.4 (*)    Platelets 420 (*)    All other components within normal limits  RESP PANEL BY RT-PCR (RSV, FLU A&B, COVID)  RVPGX2  COMPREHENSIVE METABOLIC PANEL WITH GFR    EKG: None  Radiology: No results found.   Procedures   Medications Ordered in the ED  metoCLOPramide (REGLAN) tablet 10  mg (has no administration in time range)  ketorolac  (TORADOL ) 15 MG/ML injection 15 mg (has no administration in time range)                                    Medical Decision Making Amount and/or Complexity of Data Reviewed Labs: ordered.  Risk Prescription drug management.   Chelsea West is a 48 y.o. female who presents emergency department for URI type symptoms Initial Ddx:  URI, sinusitis, pneumonia   MDM:  Feel the patient likely has a URI based on their symptoms.  They are overall well-appearing so do not feel that chest x-ray is warranted.  Based on timeline and severity of symptoms feel that treatment for sinusitis is not warranted at this time.  Plan:  COVID/flu Toradol  Reglan  ED Summary/Re-evaluation:  Patient reevaluated in the emergency department and was stable.  COVID and flu negative.  Labs were sent from triage which shows that she has anemia which is close to her baseline.  Not having any bleeding.  Says that she is already on iron  infusions for this.  She was given Toradol  and Reglan for her myalgias and nausea.  Sent home with Zofran  prescription and work note.  However follow-up with her primary doctor in 2 to 3 days.    This patient presents to the ED for concern of complaints listed in HPI, this involves an extensive number of treatment options, and is a complaint that carries with it a high risk of complications and morbidity. Disposition including potential need for admission considered.   Dispo: DC Home. Return precautions discussed including, but not limited to, those listed in the AVS. Allowed pt time to ask questions which were answered fully prior to dc.  Records reviewed Outpatient Clinic Notes The following labs were independently interpreted: CBC and show chronic anemia I have reviewed the patients home medications and made adjustments as needed  Portions of this note were generated with Dragon dictation software. Dictation errors may occur  despite best attempts at proofreading.     Final diagnoses:  Upper respiratory tract infection, unspecified type  Nausea    ED Discharge Orders          Ordered    ondansetron  (ZOFRAN -ODT) 4 MG disintegrating tablet  Every 8 hours PRN        11/21/23 1504               Yolande Lamar BROCKS, MD 11/21/23 [phone]

## 2023-12-13 ENCOUNTER — Telehealth: Payer: Self-pay | Admitting: Oncology

## 2023-12-13 NOTE — Telephone Encounter (Signed)
 Received a vm from the pt on 7/22 asking what day and time her lab appt is. I called the pt back no answer so I left a vm with the lab appt date and time.

## 2023-12-19 ENCOUNTER — Inpatient Hospital Stay: Payer: Self-pay | Attending: Oncology

## 2023-12-19 DIAGNOSIS — Z9884 Bariatric surgery status: Secondary | ICD-10-CM | POA: Insufficient documentation

## 2023-12-19 DIAGNOSIS — E119 Type 2 diabetes mellitus without complications: Secondary | ICD-10-CM | POA: Insufficient documentation

## 2023-12-19 DIAGNOSIS — Z79899 Other long term (current) drug therapy: Secondary | ICD-10-CM | POA: Insufficient documentation

## 2023-12-19 DIAGNOSIS — I10 Essential (primary) hypertension: Secondary | ICD-10-CM | POA: Insufficient documentation

## 2023-12-19 DIAGNOSIS — D509 Iron deficiency anemia, unspecified: Secondary | ICD-10-CM | POA: Insufficient documentation

## 2023-12-19 DIAGNOSIS — Z7984 Long term (current) use of oral hypoglycemic drugs: Secondary | ICD-10-CM | POA: Insufficient documentation

## 2023-12-19 LAB — CBC WITH DIFFERENTIAL/PLATELET
Abs Immature Granulocytes: 0.05 K/uL (ref 0.00–0.07)
Basophils Absolute: 0.1 K/uL (ref 0.0–0.1)
Basophils Relative: 1 %
Eosinophils Absolute: 0.1 K/uL (ref 0.0–0.5)
Eosinophils Relative: 1 %
HCT: 31.2 % — ABNORMAL LOW (ref 36.0–46.0)
Hemoglobin: 8.8 g/dL — ABNORMAL LOW (ref 12.0–15.0)
Immature Granulocytes: 1 %
Lymphocytes Relative: 30 %
Lymphs Abs: 1.4 K/uL (ref 0.7–4.0)
MCH: 21.9 pg — ABNORMAL LOW (ref 26.0–34.0)
MCHC: 28.2 g/dL — ABNORMAL LOW (ref 30.0–36.0)
MCV: 77.8 fL — ABNORMAL LOW (ref 80.0–100.0)
Monocytes Absolute: 0.5 K/uL (ref 0.1–1.0)
Monocytes Relative: 10 %
Neutro Abs: 2.6 K/uL (ref 1.7–7.7)
Neutrophils Relative %: 57 %
Platelets: 411 K/uL — ABNORMAL HIGH (ref 150–400)
RBC: 4.01 MIL/uL (ref 3.87–5.11)
RDW: 15.5 % (ref 11.5–15.5)
WBC: 4.6 K/uL (ref 4.0–10.5)
nRBC: 0 % (ref 0.0–0.2)

## 2023-12-19 LAB — IRON AND TIBC
Iron: 14 ug/dL — ABNORMAL LOW (ref 28–170)
Saturation Ratios: 3 % — ABNORMAL LOW (ref 10.4–31.8)
TIBC: 483 ug/dL — ABNORMAL HIGH (ref 250–450)
UIBC: 469 ug/dL

## 2023-12-19 LAB — FERRITIN: Ferritin: 3 ng/mL — ABNORMAL LOW (ref 11–307)

## 2023-12-20 ENCOUNTER — Inpatient Hospital Stay (HOSPITAL_BASED_OUTPATIENT_CLINIC_OR_DEPARTMENT_OTHER): Payer: Self-pay | Admitting: Oncology

## 2023-12-20 ENCOUNTER — Inpatient Hospital Stay: Payer: Self-pay

## 2023-12-20 ENCOUNTER — Encounter: Payer: Self-pay | Admitting: Oncology

## 2023-12-20 VITALS — BP 140/90 | HR 80 | Temp 97.3°F | Resp 18 | Ht 62.0 in | Wt 189.0 lb

## 2023-12-20 VITALS — BP 156/91

## 2023-12-20 DIAGNOSIS — I10 Essential (primary) hypertension: Secondary | ICD-10-CM

## 2023-12-20 DIAGNOSIS — E119 Type 2 diabetes mellitus without complications: Secondary | ICD-10-CM

## 2023-12-20 DIAGNOSIS — D509 Iron deficiency anemia, unspecified: Secondary | ICD-10-CM

## 2023-12-20 DIAGNOSIS — Z9884 Bariatric surgery status: Secondary | ICD-10-CM

## 2023-12-20 DIAGNOSIS — Z79899 Other long term (current) drug therapy: Secondary | ICD-10-CM

## 2023-12-20 DIAGNOSIS — Z7984 Long term (current) use of oral hypoglycemic drugs: Secondary | ICD-10-CM

## 2023-12-20 MED ORDER — IRON SUCROSE 20 MG/ML IV SOLN
200.0000 mg | Freq: Once | INTRAVENOUS | Status: AC
Start: 2023-12-20 — End: 2023-12-20
  Administered 2023-12-20: 200 mg via INTRAVENOUS
  Filled 2023-12-20: qty 10

## 2023-12-20 NOTE — Progress Notes (Unsigned)
 [facility]  [phone] [phone]  ID: Chelsea West OB: 13-Mar-1976  MR#: [id number]  RDW#:[id number]  Patient Care Team: Sherial Bail, MD as PCP - General (Internal Medicine) Jacobo Evalene PARAS, MD as Consulting Physician (Oncology)  CHIEF COMPLAINT: Iron  deficiency anemia.  INTERVAL HISTORY: Patient returns to clinic today for repeat laboratory, further evaluation, and consideration of additional IV Venofer .  She continues to have chronic fatigue.  She has also noticed worsening heavy menses and plans to further discuss options with gynecology.  She otherwise feels well.  She has no neurologic complaints.  She denies any recent fevers or illnesses.  She has a good appetite and denies weight loss.  She has no chest pain, shortness of breath, cough, or hemoptysis.  She denies any nausea, vomiting, constipation, or diarrhea.  She has no known or hematochezia.  She has no urinary complaints.  Patient offers no further specific complaints today.  REVIEW OF SYSTEMS:   Review of Systems  Constitutional:  Positive for malaise/fatigue. Negative for fever and weight loss.  Respiratory: Negative.  Negative for cough, hemoptysis and shortness of breath.   Cardiovascular: Negative.  Negative for chest pain and leg swelling.  Gastrointestinal:  Negative for abdominal pain, blood in stool and melena.  Genitourinary: Negative.  Negative for hematuria.  Musculoskeletal: Negative.  Negative for back pain, falls and myalgias.  Skin: Negative.  Negative for rash.  Neurological: Negative.  Negative for dizziness, focal weakness, weakness and headaches.  Psychiatric/Behavioral: Negative.  The patient is not nervous/anxious.     As per HPI. Otherwise, a complete review of systems is negative.  PAST MEDICAL HISTORY: Past Medical History:  Diagnosis Date   Anemia    Bronchitis, chronic (HCC)    Diabetes mellitus without complication (HCC)     Hypertension    Tubal pregnancy     PAST SURGICAL HISTORY: Past Surgical History:  Procedure Laterality Date   LAPAROSCOPIC GASTRIC SLEEVE RESECTION     SALPINGOSTOMY      FAMILY HISTORY: History reviewed. No pertinent family history.  ADVANCED DIRECTIVES (Y/N):  N  HEALTH MAINTENANCE: Social History   Tobacco Use   Smoking status: Never    Passive exposure: Never   Smokeless tobacco: Never  Vaping Use   Vaping status: Never Used  Substance Use Topics   Alcohol use: No   Drug use: No     Colonoscopy:  PAP:  Bone density:  Lipid panel:  Allergies  Allergen Reactions   Penicillins Shortness Of Breath    Current Outpatient Medications  Medication Sig Dispense Refill   magnesium citrate SOLN Take 1 Bottle by mouth once.     ondansetron  (ZOFRAN -ODT) 4 MG disintegrating tablet Take 1 tablet (4 mg total) by mouth every 8 (eight) hours as needed for nausea or vomiting. 12 tablet 0   amLODipine (NORVASC) 10 MG tablet Take 10 mg by mouth daily. (Patient not taking: Reported on 12/20/2023)     ferrous sulfate 325 (65 FE) MG EC tablet Take 325 mg by mouth 3 (three) times daily with meals. (Patient not taking: Reported on 12/20/2023)     metFORMIN (GLUCOPHAGE) 500 MG tablet Take 500 mg by mouth 2 (two) times daily with a meal. (Patient not taking: Reported on 12/20/2023)     naproxen  (NAPROSYN ) 500 MG tablet Take 1 tablet (500 mg total) by mouth 2 (two) times daily. (Patient not taking: Reported on 12/20/2023) 20 tablet 0   omeprazole (PRILOSEC) 20 MG capsule Take 20 mg  by mouth daily. (Patient not taking: Reported on 12/20/2023)     ondansetron  (ZOFRAN ) 4 MG tablet Take 1 tablet (4 mg total) by mouth every 6 (six) hours. (Patient not taking: Reported on 12/20/2023) 12 tablet 0   Vitamin D, Ergocalciferol, (DRISDOL) 1.25 MG (50000 UNIT) CAPS capsule Take 50,000 Units by mouth every 7 (seven) days. (Patient not taking: Reported on 12/20/2023)     No current facility-administered  medications for this visit.    OBJECTIVE: Vitals:   12/20/23 1008  BP: (!) 140/90  Pulse: 80  Resp: 18  Temp: (!) 97.3 F (36.3 C)  SpO2: 100%     Body mass index is 34.57 kg/m.    ECOG FS:1 - Symptomatic but completely ambulatory  General: Well-developed, well-nourished, no acute distress. Eyes: Pink conjunctiva, anicteric sclera. HEENT: Normocephalic, moist mucous membranes. Lungs: No audible wheezing or coughing. Heart: Regular rate and rhythm. Abdomen: Soft, nontender, no obvious distention. Musculoskeletal: No edema, cyanosis, or clubbing. Neuro: Alert, answering all questions appropriately. Cranial nerves grossly intact. Skin: No rashes or petechiae noted. Psych: Normal affect.  LAB RESULTS:  Lab Results  Component Value Date   NA 138 11/21/2023   K 3.7 11/21/2023   CL 102 11/21/2023   CO2 24 11/21/2023   GLUCOSE 82 11/21/2023   BUN 11 11/21/2023   CREATININE 0.82 11/21/2023   CALCIUM 9.0 11/21/2023   PROT 7.6 11/21/2023   ALBUMIN 3.8 11/21/2023   AST 18 11/21/2023   ALT 10 11/21/2023   ALKPHOS 65 11/21/2023   BILITOT 1.2 11/21/2023   GFRNONAA >60 11/21/2023   GFRAA 53 (L) 05/16/2019    Lab Results  Component Value Date   WBC 4.6 12/19/2023   NEUTROABS 2.6 12/19/2023   HGB 8.8 (L) 12/19/2023   HCT 31.2 (L) 12/19/2023   MCV 77.8 (L) 12/19/2023   PLT 411 (H) 12/19/2023   Lab Results  Component Value Date   IRON  14 (L) 12/19/2023   TIBC 483 (H) 12/19/2023   IRONPCTSAT 3 (L) 12/19/2023    Lab Results  Component Value Date   FERRITIN 3 (L) 12/19/2023     STUDIES: No results found.  ASSESSMENT: Iron  deficiency anemia.  PLAN:    Iron  deficiency anemia: Patient has a history of gastric sleeve and likely has poor absorption contributing.  She also reports increasingly heavy menses.  Her hemoglobin and iron  stores have trended down and she is symptomatic. Previously, all of her other laboratory work was either negative or within normal limits  including hemoglobinopathy profile.  Proceed with 200 mg IV Venofer  today.  Return to clinic 4 times over the next 1 to 2 weeks for additional treatment.  Patient with then return to clinic in 3 months with repeat laboratory work, further evaluation, and consideration of additional treatment if necessary. Heavy menses: Patient plans to further discuss treatment options with gynecology. Hypertension: Chronic and unchanged.  Patient's blood pressure is moderately elevated today.  Continue monitoring and treatment per primary care.    I spent a total of 30 minutes reviewing chart data, face-to-face evaluation with the patient, counseling and coordination of care as detailed above.   Patient expressed understanding and was in agreement with this plan. She also understands that She can call clinic at any time with any questions, concerns, or complaints.     Evalene JINNY Reusing, MD   12/20/2023 10:38 AM

## 2023-12-20 NOTE — Patient Instructions (Signed)

## 2023-12-20 NOTE — Progress Notes (Unsigned)
 Patient is still having fatigue. Nausea and zofran  isn't helping as much as it did before. Still having the cough from when she was in the hospital.

## 2023-12-20 NOTE — Progress Notes (Signed)
 Patient for IR Port Insertion on Wed 12/21/23, I called and spoke with the patient on the phone and gave pre-procedure instructions. Pt was made aware to be here at 1p, NPO after MN prior to procedure as well as driver post procedure/recovery/discharge. Pt stated understanding.  Called   12/20/23

## 2023-12-21 ENCOUNTER — Other Ambulatory Visit: Payer: Self-pay

## 2023-12-21 ENCOUNTER — Encounter: Payer: Self-pay | Admitting: Radiology

## 2023-12-21 ENCOUNTER — Ambulatory Visit
Admission: RE | Admit: 2023-12-21 | Discharge: 2023-12-21 | Disposition: A | Discharge status: Home / Self Care | Payer: Self-pay | Source: Ambulatory Visit | Attending: Oncology | Admitting: Oncology

## 2023-12-21 ENCOUNTER — Encounter: Payer: Self-pay | Admitting: Oncology

## 2023-12-21 DIAGNOSIS — I10 Essential (primary) hypertension: Secondary | ICD-10-CM | POA: Insufficient documentation

## 2023-12-21 DIAGNOSIS — D509 Iron deficiency anemia, unspecified: Secondary | ICD-10-CM | POA: Insufficient documentation

## 2023-12-21 HISTORY — PX: IR IMAGING GUIDED PORT INSERTION: IMG5740

## 2023-12-21 MED ORDER — LIDOCAINE-EPINEPHRINE 1 %-1:100000 IJ SOLN
INTRAMUSCULAR | Status: AC
  Filled 2023-12-21: qty 1

## 2023-12-21 MED ORDER — FENTANYL CITRATE (PF) 100 MCG/2ML IJ SOLN
INTRAMUSCULAR | Status: AC
  Filled 2023-12-21: qty 2

## 2023-12-21 MED ORDER — SODIUM CHLORIDE 0.9 % IV SOLN: INTRAVENOUS | Status: DC

## 2023-12-21 MED ORDER — MIDAZOLAM HCL 5 MG/5ML IJ SOLN
INTRAMUSCULAR | Status: AC | PRN
Start: 2023-12-21 — End: 2023-12-21
  Administered 2023-12-21 (×2): 1 mg via INTRAVENOUS

## 2023-12-21 MED ORDER — FENTANYL CITRATE (PF) 100 MCG/2ML IJ SOLN
INTRAMUSCULAR | Status: AC | PRN
  Administered 2023-12-21 (×2): 50 ug via INTRAVENOUS

## 2023-12-21 MED ORDER — MIDAZOLAM HCL 2 MG/2ML IJ SOLN
INTRAMUSCULAR | Status: AC
  Filled 2023-12-21: qty 2

## 2023-12-21 MED ORDER — LIDOCAINE-EPINEPHRINE 1 %-1:100000 IJ SOLN
20.0000 mL | Freq: Once | INTRAMUSCULAR | Status: AC
  Administered 2023-12-21: 20 mL via INTRADERMAL

## 2023-12-21 MED ORDER — HEPARIN SOD (PORK) LOCK FLUSH 100 UNIT/ML IV SOLN
500.0000 [IU] | Freq: Once | INTRAVENOUS | Status: AC
  Administered 2023-12-21: 500 [IU] via INTRAVENOUS

## 2023-12-21 MED ORDER — HEPARIN SOD (PORK) LOCK FLUSH 100 UNIT/ML IV SOLN
INTRAVENOUS | Status: AC
  Filled 2023-12-21: qty 5

## 2023-12-21 NOTE — Discharge Instructions (Signed)
 Implanted [facility] Guide  An implanted port is a type of central line that is placed under the skin. Central lines are used to provide IV access when treatment or nutrition needs to be given through a person's veins. Implanted ports are used for long-term IV access. An implanted port may be placed because: You need IV medicine that would be irritating to the small veins in your hands or arms. You need long-term IV medicines, such as antibiotics. You need IV nutrition for a long period. You need frequent blood draws for lab tests. You need dialysis.   Implanted ports are usually placed in the chest area, but they can also be placed in the upper arm, the abdomen, or the leg. An implanted port has two main parts: Reservoir. The reservoir is round and will appear as a small, raised area under your skin. The reservoir is the part where a needle is inserted to give medicines or draw blood. Catheter. The catheter is a thin, flexible tube that extends from the reservoir. The catheter is placed into a large vein. Medicine that is inserted into the reservoir goes into the catheter and then into the vein.   How will I care for my incision  You may shower tomorrow Please remove dressing in 24hrs not other skin care is needed  How is my port accessed? Special steps must be taken to access the port: Before the port is accessed, a numbing cream can be placed on the skin. This helps numb the skin over the port site. Your health care provider uses a sterile technique to access the port. Your health care provider must put on a mask and sterile gloves. The skin over your port is cleaned carefully with an antiseptic and allowed to dry. The port is gently pinched between sterile gloves, and a needle is inserted into the port. Only "non-coring" port needles should be used to access the port. Once the port is accessed, a blood return should be checked. This helps ensure that the port is in the vein and is not  clogged. If your port needs to remain accessed for a constant infusion, a clear (transparent) bandage will be placed over the needle site. The bandage and needle will need to be changed every week, or as directed by your health care provider.   What is flushing? Flushing helps keep the port from getting clogged. Follow your health care provider's instructions on how and when to flush the port. Ports are usually flushed with saline solution or a medicine called heparin. The need for flushing will depend on how the port is used. If the port is used for intermittent medicines or blood draws, the port will need to be flushed: After medicines have been given. After blood has been drawn. As part of routine maintenance. If a constant infusion is running, the port may not need to be flushed.   How long will my port stay implanted? The port can stay in for as long as your health care provider thinks it is needed. When it is time for the port to come out, surgery will be done to remove it. The procedure is similar to the one performed when the port was put in. When should I seek immediate medical care? When you have an implanted port, you should seek immediate medical care if: You notice a bad smell coming from the incision site. You have swelling, redness, or drainage at the incision site. You have more swelling or pain at the  port site or the surrounding area. You have a fever that is not controlled with medicine.   This information is not intended to replace advice given to you by your health care provider. Make sure you discuss any questions you have with your health care provider. Document Released: 05/10/2005 Document Revised: 10/16/2015 Document Reviewed: 01/15/2013 Elsevier Interactive Patient Education  2017 [employer].

## 2023-12-21 NOTE — Progress Notes (Signed)
 Patient clinically stable post IR Port placement per Dr Vanice, tolerated well. Vitals stable post procedure. Received Versed  2 mg along with Fentanyl  100 mcg IV for procedure. Report given to Ruthanna Kitty RN post procedure./specials/14

## 2023-12-21 NOTE — H&P (Signed)
 Chief Complaint: Iron  deficiency anemia requiring frequent iron  infusions  Referring Provider(s): Finnegan,Timothy J   Supervising Physician: Vanice Revel  Patient Status: [facility] - Out-pt  History of Present Illness: Chelsea West is a 48 y.o. female with PMH significant for HTN, heavy menstrual periods, gastric sleeve procedure who has been requiring frequent iron  infusions (her last visit with Dr. Jacobo notes plan for treatments as frequently as 4 times in next 2 weeks period) and frequent lab draws. She has experienced difficulty with obtaining IV access in the past. Documentation from Epic indicates that patient has required infusions for anemia since at least  March of 2024.  Confirms NPO since MN and ride/supervision available for 24 hours.  Does not wear CPAP or use supplemental home O2.  Denies fever, chills, SOB, CP, sore throat, N/V, abd pain, blood in stool or urine, abnormal bruising. She does endorse fatigue. Not currently experiencing vaginal bleeding.   Allergies Reviewed:  Penicillins   Patient is Full Code  Past Medical History:  Diagnosis Date   Anemia    Bronchitis, chronic (HCC)    Diabetes mellitus without complication (HCC)    Hypertension    Tubal pregnancy     Past Surgical History:  Procedure Laterality Date   LAPAROSCOPIC GASTRIC SLEEVE RESECTION     SALPINGOSTOMY        Medications: Prior to Admission medications   Medication Sig Start Date End Date Taking? Authorizing Provider  amLODipine (NORVASC) 10 MG tablet Take 10 mg by mouth daily. Patient not taking: Reported on 12/20/2023    [provider]  ferrous sulfate 325 (65 FE) MG EC tablet Take 325 mg by mouth 3 (three) times daily with meals. Patient not taking: Reported on 12/20/2023    [provider]  magnesium citrate SOLN Take 1 Bottle by mouth once.    [provider]  metFORMIN (GLUCOPHAGE) 500 MG tablet Take 500 mg by mouth 2 (two) times daily  with a meal. Patient not taking: Reported on 12/20/2023    [provider]  naproxen  (NAPROSYN ) 500 MG tablet Take 1 tablet (500 mg total) by mouth 2 (two) times daily. Patient not taking: Reported on 12/20/2023 01/07/23   Zammit, Joseph, MD  omeprazole (PRILOSEC) 20 MG capsule Take 20 mg by mouth daily. Patient not taking: Reported on 12/20/2023    [provider]  ondansetron  (ZOFRAN ) 4 MG tablet Take 1 tablet (4 mg total) by mouth every 6 (six) hours. Patient not taking: Reported on 12/20/2023 04/21/22   Lang Norleen POUR, PA-C  ondansetron  (ZOFRAN -ODT) 4 MG disintegrating tablet Take 1 tablet (4 mg total) by mouth every 8 (eight) hours as needed for nausea or vomiting. 11/21/23   Yolande Lamar BROCKS, MD  Vitamin D, Ergocalciferol, (DRISDOL) 1.25 MG (50000 UNIT) CAPS capsule Take 50,000 Units by mouth every 7 (seven) days. Patient not taking: Reported on 12/20/2023    [provider]     History reviewed. No pertinent family history.  Social History   Socioeconomic History   Marital status: Divorced    Spouse name: Not on file   Number of children: Not on file   Years of education: Not on file   Highest education level: Not on file  Occupational History   Not on file  Tobacco Use   Smoking status: Never    Passive exposure: Never   Smokeless tobacco: Never  Vaping Use   Vaping status: Never Used  Substance and Sexual Activity   Alcohol  use: No   Drug use: No   Sexual activity: Yes    Birth control/protection: None  Other Topics Concern   Not on file  Social History Narrative   Not on file   Social Drivers of Health   Financial Resource Strain: Low Risk  (08/12/2022)   Overall Financial Resource Strain (CARDIA)    Difficulty of Paying Living Expenses: Not hard at all  Food Insecurity: No Food Insecurity (08/12/2022)   Hunger Vital Sign    Worried About Running Out of Food in the Last Year: Never true    Ran Out of Food in the Last Year: Never true   Transportation Needs: No Transportation Needs (08/12/2022)   PRAPARE - Administrator, Civil Service (Medical): No    Lack of Transportation (Non-Medical): No  Physical Activity: Not on file  Stress: Not on file  Social Connections: Not on file     Review of Systems: A 12 point ROS discussed and pertinent positives are indicated in the HPI above.  All other systems are negative.    Vital Signs: BP (!) 146/73   Pulse 64   Temp (!) 97.3 F (36.3 C) (Temporal)   Resp 15   Ht 5' 2 (1.575 m)   Wt 189 lb (85.7 kg)   SpO2 100%   BMI 34.57 kg/m     Physical Exam HENT:     Mouth/Throat:     Mouth: Mucous membranes are moist.     Pharynx: Oropharynx is clear.  Cardiovascular:     Rate and Rhythm: Normal rate and regular rhythm.     Pulses: Normal pulses.     Heart sounds: Normal heart sounds.  Pulmonary:     Effort: Pulmonary effort is normal.     Breath sounds: Normal breath sounds.  Abdominal:     Palpations: Abdomen is soft.     Tenderness: There is no abdominal tenderness.  Skin:    General: Skin is warm and dry.     Coloration: Skin is not jaundiced.     Comments: No wounds or rash over planned puncture site for port  Neurological:     Mental Status: She is alert and oriented to person, place, and time.  Psychiatric:        Mood and Affect: Mood normal.        Behavior: Behavior normal.        Thought Content: Thought content normal.        Judgment: Judgment normal.     Imaging: No results found.  Labs:  CBC: Recent Labs    08/15/23 1027 08/28/23 1748 11/21/23 1208 12/19/23 0952  WBC 4.3 4.4 5.0 4.6  HGB 12.6 11.6* 10.1* 8.8*  HCT 40.8 36.9 34.4* 31.2*  PLT 354 337 420* 411*    COAGS: No results for input(s): INR, APTT in the last 8760 hours.  BMP: Recent Labs    01/07/23 1900 08/28/23 1748 11/21/23 1208  NA 136 138 138  K 3.5 3.3* 3.7  CL 97* 104 102  CO2 24 22 24   GLUCOSE 82 78 82  BUN 12 11 11   CALCIUM 8.8* 8.8*  9.0  CREATININE 0.69 0.74 0.82  GFRNONAA >60 >60 >60    LIVER FUNCTION TESTS: Recent Labs    11/21/23 1208  BILITOT 1.2  AST 18  ALT 10  ALKPHOS 65  PROT 7.6  ALBUMIN 3.8    TUMOR MARKERS: No results for input(s): AFPTM, CEA, CA199, CHROMGRNA in the last 8760  hours.  Assessment and Plan:  Request for image guided right port insertion approved for 7/30 with Dr. Vanice No contraindications for procedure identified in ROS, physical exam, or review of pre-sedation considerations. 7/28 Labs reviewed and within acceptable range; upreg ordered VSS, afebrile Patient not asked to hold any AC/AP for this low bleeding risk procedure Abx not indicated    Risks and benefits of image guided port-a-catheter placement was discussed with the patient including, but not limited to bleeding, infection, pneumothorax, or fibrin sheath development and need for additional procedures.  All of the patient's questions were answered, patient is agreeable to proceed. Consent signed and in chart.   Thank you for allowing our service to participate in JACQUILINE ZURCHER 's care.    Electronically Signed: Laymon Coast, NP   12/21/2023, 1:52 PM     I spent a total of  15 Minutes   in face to face in clinical consultation, greater than 50% of which was counseling/coordinating care for image guided right port insertion.   (A copy of this note was sent to the referring provider and the time of visit.)

## 2023-12-21 NOTE — Procedures (Signed)
Interventional Radiology Procedure Note  Procedure: RT internal jugular POWER PORT    Complications: None  Estimated Blood Loss:  MIN  Findings: TIP SVCRA    M. TREVOR Levaeh Vice, MD    

## 2023-12-23 ENCOUNTER — Inpatient Hospital Stay: Payer: Self-pay | Attending: Oncology

## 2023-12-23 ENCOUNTER — Other Ambulatory Visit: Payer: Self-pay | Admitting: *Deleted

## 2023-12-23 VITALS — BP 144/91 | HR 72 | Temp 98.3°F | Resp 16

## 2023-12-23 DIAGNOSIS — Z79899 Other long term (current) drug therapy: Secondary | ICD-10-CM | POA: Insufficient documentation

## 2023-12-23 DIAGNOSIS — D509 Iron deficiency anemia, unspecified: Secondary | ICD-10-CM | POA: Insufficient documentation

## 2023-12-23 MED ORDER — LIDOCAINE-PRILOCAINE 2.5-2.5 % EX CREA: 1.0000 | TOPICAL_CREAM | CUTANEOUS | 3 refills | Status: AC | PRN

## 2023-12-23 MED ORDER — SODIUM CHLORIDE 0.9% FLUSH
10.0000 mL | Freq: Once | INTRAVENOUS | Status: AC | PRN
  Administered 2023-12-23: 10 mL
  Filled 2023-12-23: qty 10

## 2023-12-23 MED ORDER — HEPARIN SOD (PORK) LOCK FLUSH 100 UNIT/ML IV SOLN
500.0000 [IU] | Freq: Once | INTRAVENOUS | Status: AC | PRN
  Administered 2023-12-23: 500 [IU]
  Filled 2023-12-23: qty 5

## 2023-12-23 MED ORDER — IRON SUCROSE 20 MG/ML IV SOLN
200.0000 mg | Freq: Once | INTRAVENOUS | Status: AC
  Administered 2023-12-23: 200 mg via INTRAVENOUS
  Filled 2023-12-23: qty 10

## 2023-12-23 NOTE — Patient Instructions (Signed)

## 2023-12-23 NOTE — Progress Notes (Signed)
 Pt declined post observation. Aware of risks. Vitals stable at discharge.

## 2023-12-26 ENCOUNTER — Inpatient Hospital Stay: Payer: Self-pay

## 2023-12-26 VITALS — BP 136/88 | HR 75 | Temp 97.2°F | Resp 18

## 2023-12-26 DIAGNOSIS — D509 Iron deficiency anemia, unspecified: Secondary | ICD-10-CM

## 2023-12-26 MED ORDER — IRON SUCROSE 20 MG/ML IV SOLN
200.0000 mg | Freq: Once | INTRAVENOUS | Status: AC
Start: 2023-12-26 — End: 2023-12-26
  Administered 2023-12-26: 200 mg via INTRAVENOUS
  Filled 2023-12-26: qty 10

## 2023-12-26 NOTE — Patient Instructions (Signed)

## 2023-12-28 ENCOUNTER — Inpatient Hospital Stay: Payer: Self-pay

## 2023-12-28 VITALS — BP 133/85 | HR 74 | Temp 95.3°F | Resp 18

## 2023-12-28 DIAGNOSIS — D509 Iron deficiency anemia, unspecified: Secondary | ICD-10-CM

## 2023-12-28 MED ORDER — IRON SUCROSE 20 MG/ML IV SOLN
200.0000 mg | Freq: Once | INTRAVENOUS | Status: AC
  Administered 2023-12-28: 200 mg via INTRAVENOUS
  Filled 2023-12-28: qty 10

## 2023-12-30 ENCOUNTER — Inpatient Hospital Stay: Payer: Self-pay

## 2023-12-30 VITALS — BP 121/83 | HR 78 | Temp 97.8°F | Resp 16

## 2023-12-30 DIAGNOSIS — D509 Iron deficiency anemia, unspecified: Secondary | ICD-10-CM

## 2023-12-30 MED ORDER — IRON SUCROSE 20 MG/ML IV SOLN
200.0000 mg | Freq: Once | INTRAVENOUS | Status: AC
Start: 2023-12-30 — End: 2023-12-30
  Administered 2023-12-30: 200 mg via INTRAVENOUS
  Filled 2023-12-30: qty 10

## 2023-12-30 NOTE — Patient Instructions (Signed)

## 2023-12-30 NOTE — Progress Notes (Signed)
 Declined post-observation. Aware of risks. Vitals stable at discharge.

## 2024-02-24 ENCOUNTER — Telehealth: Payer: Self-pay | Admitting: Oncology

## 2024-02-24 NOTE — Telephone Encounter (Signed)
 Pt left vm wanting to know appt details.  I called pt back and confirmed appt date/times.   Pt labs are scheduled in the morning and MD/iron  in the afternoon,  I did offer labs on a different day but pt stated it was okay to keep the appts as they are and she will find something to do in between the lab and MD appt.

## 2024-03-21 ENCOUNTER — Other Ambulatory Visit: Payer: Self-pay

## 2024-03-22 ENCOUNTER — Ambulatory Visit: Payer: Self-pay | Admitting: Oncology

## 2024-03-22 ENCOUNTER — Ambulatory Visit: Payer: Self-pay

## 2024-03-22 ENCOUNTER — Inpatient Hospital Stay: Payer: Self-pay | Attending: Oncology

## 2024-03-22 DIAGNOSIS — R5383 Other fatigue: Secondary | ICD-10-CM | POA: Insufficient documentation

## 2024-03-22 DIAGNOSIS — Z79899 Other long term (current) drug therapy: Secondary | ICD-10-CM | POA: Insufficient documentation

## 2024-03-22 DIAGNOSIS — D509 Iron deficiency anemia, unspecified: Secondary | ICD-10-CM | POA: Insufficient documentation

## 2024-03-22 DIAGNOSIS — Z9884 Bariatric surgery status: Secondary | ICD-10-CM | POA: Insufficient documentation

## 2024-03-22 DIAGNOSIS — N920 Excessive and frequent menstruation with regular cycle: Secondary | ICD-10-CM | POA: Insufficient documentation

## 2024-03-22 LAB — CBC WITH DIFFERENTIAL (CANCER CENTER ONLY)
Abs Immature Granulocytes: 0.03 K/uL (ref 0.00–0.07)
Basophils Absolute: 0 K/uL (ref 0.0–0.1)
Basophils Relative: 1 %
Eosinophils Absolute: 0.1 K/uL (ref 0.0–0.5)
Eosinophils Relative: 2 %
HCT: 30.2 % — ABNORMAL LOW (ref 36.0–46.0)
Hemoglobin: 9.5 g/dL — ABNORMAL LOW (ref 12.0–15.0)
Immature Granulocytes: 1 %
Lymphocytes Relative: 29 %
Lymphs Abs: 1.5 K/uL (ref 0.7–4.0)
MCH: 25.3 pg — ABNORMAL LOW (ref 26.0–34.0)
MCHC: 31.5 g/dL (ref 30.0–36.0)
MCV: 80.5 fL (ref 80.0–100.0)
Monocytes Absolute: 0.6 K/uL (ref 0.1–1.0)
Monocytes Relative: 12 %
Neutro Abs: 2.8 K/uL (ref 1.7–7.7)
Neutrophils Relative %: 55 %
Platelet Count: 356 K/uL (ref 150–400)
RBC: 3.75 MIL/uL — ABNORMAL LOW (ref 3.87–5.11)
RDW: 16.3 % — ABNORMAL HIGH (ref 11.5–15.5)
WBC Count: 5 K/uL (ref 4.0–10.5)
nRBC: 0 % (ref 0.0–0.2)

## 2024-03-22 LAB — IRON AND TIBC
Iron: 20 ug/dL — ABNORMAL LOW (ref 28–170)
Saturation Ratios: 4 % — ABNORMAL LOW (ref 10.4–31.8)
TIBC: 477 ug/dL — ABNORMAL HIGH (ref 250–450)
UIBC: 457 ug/dL

## 2024-03-22 LAB — FERRITIN: Ferritin: 6 ng/mL — ABNORMAL LOW (ref 11–307)

## 2024-03-23 ENCOUNTER — Encounter: Payer: Self-pay | Admitting: Oncology

## 2024-03-23 ENCOUNTER — Inpatient Hospital Stay: Payer: Self-pay

## 2024-03-23 ENCOUNTER — Inpatient Hospital Stay (HOSPITAL_BASED_OUTPATIENT_CLINIC_OR_DEPARTMENT_OTHER): Payer: Self-pay | Admitting: Oncology

## 2024-03-23 VITALS — BP 132/92 | HR 75 | Temp 97.1°F | Resp 18 | Ht 62.0 in | Wt 199.0 lb

## 2024-03-23 VITALS — BP 169/94 | HR 65 | Temp 95.1°F | Resp 19

## 2024-03-23 DIAGNOSIS — D509 Iron deficiency anemia, unspecified: Secondary | ICD-10-CM

## 2024-03-23 MED ORDER — IRON SUCROSE 20 MG/ML IV SOLN
200.0000 mg | Freq: Once | INTRAVENOUS | Status: AC
  Administered 2024-03-23: 200 mg via INTRAVENOUS
  Filled 2024-03-23: qty 10

## 2024-03-23 NOTE — Progress Notes (Signed)
 Severely fatigued. Headaches, knee cap achy feeling, especially at night.

## 2024-03-23 NOTE — Progress Notes (Signed)
 [facility]  Telephone:([phone]) [phone] Fax:([phone]) [phone]  ID: Chelsea West OB: 05-10-1976  MR#: [id number]  RDW#:[id number]  Patient Care Team: Sherial Bail, MD as PCP - General (Internal Medicine) [name], MD as Consulting Physician (Oncology)  CHIEF COMPLAINT: Iron  deficiency anemia.  INTERVAL HISTORY: Patient returns to clinic today for repeat laboratory, further evaluation, and consideration of additional IV Venofer .  She continues to have chronic weakness and fatigue.  She continues to have heavy menses but has not yet discussed options with gynecology.  She otherwise feels well.  She has no neurologic complaints.  She denies any recent fevers or illnesses.  She has a good appetite and denies weight loss.  She has no chest pain, shortness of breath, cough, or hemoptysis.  She denies any nausea, vomiting, constipation, or diarrhea.  She has no known or hematochezia.  She has no urinary complaints.  Patient offers no further specific complaints today.    REVIEW OF SYSTEMS:   Review of Systems  Constitutional:  Positive for malaise/fatigue. Negative for fever and weight loss.  Respiratory: Negative.  Negative for cough, hemoptysis and shortness of breath.   Cardiovascular: Negative.  Negative for chest pain and leg swelling.  Gastrointestinal:  Negative for abdominal pain, blood in stool and melena.  Genitourinary: Negative.  Negative for hematuria.  Musculoskeletal: Negative.  Negative for back pain, falls and myalgias.  Skin: Negative.  Negative for rash.  Neurological: Negative.  Negative for dizziness, focal weakness, weakness and headaches.  Psychiatric/Behavioral: Negative.  The patient is not nervous/anxious.     As per HPI. Otherwise, a complete review of systems is negative.  PAST MEDICAL HISTORY: Past Medical History:  Diagnosis Date   Anemia    Bronchitis, chronic (HCC)    Diabetes mellitus without complication (HCC)     Hypertension    Tubal pregnancy     PAST SURGICAL HISTORY: Past Surgical History:  Procedure Laterality Date   IR IMAGING GUIDED PORT INSERTION  12/21/2023   LAPAROSCOPIC GASTRIC SLEEVE RESECTION     SALPINGOSTOMY      FAMILY HISTORY: History reviewed. No pertinent family history.  ADVANCED DIRECTIVES (Y/N):  N  HEALTH MAINTENANCE: Social History   Tobacco Use   Smoking status: Never    Passive exposure: Never   Smokeless tobacco: Never  Vaping Use   Vaping status: Never Used  Substance Use Topics   Alcohol use: No   Drug use: No     Colonoscopy:  PAP:  Bone density:  Lipid panel:  Allergies  Allergen Reactions   Penicillins Shortness Of Breath    Current Outpatient Medications  Medication Sig Dispense Refill   lidocaine -prilocaine  (EMLA ) cream Apply 1 Application topically as needed. Apply 1 hour prior to infusion appointment. Cover with plastic wrap. 30 g 3   magnesium citrate SOLN Take 1 Bottle by mouth once.     ondansetron  (ZOFRAN -ODT) 4 MG disintegrating tablet Take 1 tablet (4 mg total) by mouth every 8 (eight) hours as needed for nausea or vomiting. 12 tablet 0   amLODipine (NORVASC) 10 MG tablet Take 10 mg by mouth daily. (Patient not taking: Reported on 03/23/2024)     ferrous sulfate 325 (65 FE) MG EC tablet Take 325 mg by mouth 3 (three) times daily with meals. (Patient not taking: Reported on 03/23/2024)     metFORMIN (GLUCOPHAGE) 500 MG tablet Take 500 mg by mouth 2 (two) times daily with a meal. (Patient not taking: Reported on 03/23/2024)  naproxen  (NAPROSYN ) 500 MG tablet Take 1 tablet (500 mg total) by mouth 2 (two) times daily. (Patient not taking: Reported on 03/23/2024) 20 tablet 0   omeprazole (PRILOSEC) 20 MG capsule Take 20 mg by mouth daily. (Patient not taking: Reported on 03/23/2024)     ondansetron  (ZOFRAN ) 4 MG tablet Take 1 tablet (4 mg total) by mouth every 6 (six) hours. (Patient not taking: Reported on 03/23/2024) 12 tablet 0    Vitamin D, Ergocalciferol, (DRISDOL) 1.25 MG (50000 UNIT) CAPS capsule Take 50,000 Units by mouth every 7 (seven) days. (Patient not taking: Reported on 03/23/2024)     No current facility-administered medications for this visit.    OBJECTIVE: Vitals:   03/23/24 1033  BP: (!) 132/92  Pulse: 75  Resp: 18  Temp: (!) 97.1 F (36.2 C)  SpO2: 100%     Body mass index is 36.4 kg/m.    ECOG FS:1 - Symptomatic but completely ambulatory  General: Well-developed, well-nourished, no acute distress. Eyes: Pink conjunctiva, anicteric sclera. HEENT: Normocephalic, moist mucous membranes. Lungs: No audible wheezing or coughing. Heart: Regular rate and rhythm. Abdomen: Soft, nontender, no obvious distention. Musculoskeletal: No edema, cyanosis, or clubbing. Neuro: Alert, answering all questions appropriately. Cranial nerves grossly intact. Skin: No rashes or petechiae noted. Psych: Normal affect.  LAB RESULTS:  Lab Results  Component Value Date   NA 138 11/21/2023   K 3.7 11/21/2023   CL 102 11/21/2023   CO2 24 11/21/2023   GLUCOSE 82 11/21/2023   BUN 11 11/21/2023   CREATININE 0.82 11/21/2023   CALCIUM 9.0 11/21/2023   PROT 7.6 11/21/2023   ALBUMIN 3.8 11/21/2023   AST 18 11/21/2023   ALT 10 11/21/2023   ALKPHOS 65 11/21/2023   BILITOT 1.2 11/21/2023   GFRNONAA >60 11/21/2023   GFRAA 53 (L) 05/16/2019    Lab Results  Component Value Date   WBC 5.0 03/22/2024   NEUTROABS 2.8 03/22/2024   HGB 9.5 (L) 03/22/2024   HCT 30.2 (L) 03/22/2024   MCV 80.5 03/22/2024   PLT 356 03/22/2024   Lab Results  Component Value Date   IRON  20 (L) 03/22/2024   TIBC 477 (H) 03/22/2024   IRONPCTSAT 4 (L) 03/22/2024    Lab Results  Component Value Date   FERRITIN 6 (L) 03/22/2024     STUDIES: No results found.  ASSESSMENT: Iron  deficiency anemia.  PLAN:    Iron  deficiency anemia: Patient has a history of gastric sleeve and likely has poor absorption contributing.  She also  reports increasingly heavy menses.  Hemoglobin and iron  stores have only mildly improved despite recent IV Venofer .  Previously, all of her other laboratory work was either negative or within normal limits including hemoglobinopathy profile.  Proceed with 200 mg IV Venofer  today.  Return to clinic 4 times over the next 1 to 2 weeks to receive additional treatments.  Patient will then return to clinic in 3 months with repeat laboratory work, further evaluation, and continuation of treatment if needed.   Heavy menses: Patient plans to further discuss treatment options with gynecology. Hypertension: Patient has improved blood pressures close control.  Continue monitoring and treatment per primary care.    I spent a total of 30 minutes reviewing chart data, face-to-face evaluation with the patient, counseling and coordination of care as detailed above.    Patient expressed understanding and was in agreement with this plan. She also understands that She can call clinic at any time with any questions, concerns, or complaints.  Chelsea JINNY Reusing, MD   03/23/2024 10:59 AM

## 2024-03-27 ENCOUNTER — Inpatient Hospital Stay: Payer: Self-pay | Attending: Oncology

## 2024-03-27 ENCOUNTER — Inpatient Hospital Stay: Payer: Self-pay

## 2024-03-27 VITALS — BP 157/89 | HR 64 | Temp 96.6°F | Resp 18

## 2024-03-27 DIAGNOSIS — D509 Iron deficiency anemia, unspecified: Secondary | ICD-10-CM

## 2024-03-27 MED ORDER — IRON SUCROSE 20 MG/ML IV SOLN
200.0000 mg | Freq: Once | INTRAVENOUS | Status: AC
  Administered 2024-03-27: 200 mg via INTRAVENOUS
  Filled 2024-03-27: qty 10

## 2024-03-29 ENCOUNTER — Inpatient Hospital Stay: Payer: Self-pay

## 2024-03-29 VITALS — BP 144/77 | HR 65 | Temp 97.2°F | Resp 16

## 2024-03-29 DIAGNOSIS — D509 Iron deficiency anemia, unspecified: Secondary | ICD-10-CM

## 2024-03-29 MED ORDER — IRON SUCROSE 20 MG/ML IV SOLN
200.0000 mg | Freq: Once | INTRAVENOUS | Status: AC
  Administered 2024-03-29: 200 mg via INTRAVENOUS
  Filled 2024-03-29: qty 10

## 2024-03-29 NOTE — Patient Instructions (Signed)

## 2024-04-02 ENCOUNTER — Inpatient Hospital Stay: Payer: Self-pay

## 2024-04-02 VITALS — BP 140/85 | HR 62 | Temp 98.0°F | Resp 18

## 2024-04-02 DIAGNOSIS — D509 Iron deficiency anemia, unspecified: Secondary | ICD-10-CM

## 2024-04-02 MED ORDER — IRON SUCROSE 20 MG/ML IV SOLN
200.0000 mg | Freq: Once | INTRAVENOUS | Status: AC
  Administered 2024-04-02: 200 mg via INTRAVENOUS
  Filled 2024-04-02: qty 10

## 2024-04-04 ENCOUNTER — Inpatient Hospital Stay: Payer: Self-pay

## 2024-04-04 VITALS — BP 150/91 | HR 67 | Temp 97.5°F | Resp 18

## 2024-04-04 DIAGNOSIS — D509 Iron deficiency anemia, unspecified: Secondary | ICD-10-CM

## 2024-04-04 MED ORDER — IRON SUCROSE 20 MG/ML IV SOLN
200.0000 mg | Freq: Once | INTRAVENOUS | Status: AC
  Administered 2024-04-04: 200 mg via INTRAVENOUS
  Filled 2024-04-04: qty 10

## 2024-05-04 ENCOUNTER — Telehealth: Payer: Self-pay

## 2024-05-04 ENCOUNTER — Telehealth: Payer: Self-pay | Admitting: Oncology

## 2024-05-04 NOTE — Telephone Encounter (Signed)
 Patient reports feeling shaky/tremors.  This morning she woke up with feeling of heaviness in chest that improved after vomiting.  Explains the vomit as brown coffee grain consistency with 4 episodes already today.  Does have occasional right side abdominal pain described as cramping.    Spoke with Dr. Jacobo, advised pt to proceed to ED due to the coffee grain consistency vomit could indicate GI bleed.  Patient agrees with plan

## 2024-05-04 NOTE — Telephone Encounter (Signed)
 Pt called to let us  know that she is feeling extra jittery this morning - said something feels off - said she wasn't sure if she was needing venofer  or something else - forwarded call to triage - [facility]

## 2024-05-04 NOTE — Telephone Encounter (Signed)
 Called placed to patient to follow up on symptoms from this morning.  She states that she took Ondansetron  that has helped with the nausea and also took a Prilosec that has helped with chest pain.    Has not contacted PCP but has been thinking about going to the ED.  Advised she should proceed to ED for evaluation to ensure no heart issue or GI bleed.

## 2024-06-22 ENCOUNTER — Telehealth: Payer: Self-pay | Admitting: Oncology

## 2024-06-22 NOTE — Telephone Encounter (Signed)
 Due to weather, clinic delayed start time, I called and spoke with pt and confirmed new start time

## 2024-06-24 ENCOUNTER — Telehealth: Payer: Self-pay | Admitting: Oncology

## 2024-06-24 NOTE — Telephone Encounter (Signed)
 Due to weather, clinic closed 2/2. I spoke with pt and confirmed new appt dates for 2/17 & 2/18.

## 2024-06-25 ENCOUNTER — Inpatient Hospital Stay: Payer: Self-pay

## 2024-06-25 ENCOUNTER — Inpatient Hospital Stay: Payer: Self-pay | Attending: Oncology

## 2024-06-26 ENCOUNTER — Inpatient Hospital Stay: Payer: Self-pay

## 2024-06-26 ENCOUNTER — Inpatient Hospital Stay: Payer: Self-pay | Admitting: Oncology

## 2024-07-10 ENCOUNTER — Inpatient Hospital Stay: Payer: Self-pay

## 2024-07-11 ENCOUNTER — Inpatient Hospital Stay: Payer: Self-pay | Admitting: Oncology

## 2024-07-11 ENCOUNTER — Inpatient Hospital Stay: Payer: Self-pay
